# Patient Record
Sex: Male | Born: 1950 | Race: White | Hispanic: No | Marital: Married | Smoking: Former smoker
Health system: Southern US, Community
[De-identification: ages and names within clinical notes are randomized; demographics above are authoritative.]

## PROBLEM LIST (undated history)

## (undated) DIAGNOSIS — E785 Hyperlipidemia, unspecified: Secondary | ICD-10-CM

## (undated) DIAGNOSIS — I10 Essential (primary) hypertension: Secondary | ICD-10-CM

## (undated) DIAGNOSIS — E876 Hypokalemia: Secondary | ICD-10-CM

## (undated) DIAGNOSIS — N289 Disorder of kidney and ureter, unspecified: Secondary | ICD-10-CM

## (undated) DIAGNOSIS — R7303 Prediabetes: Secondary | ICD-10-CM

## (undated) DIAGNOSIS — I2510 Atherosclerotic heart disease of native coronary artery without angina pectoris: Secondary | ICD-10-CM

## (undated) DIAGNOSIS — I469 Cardiac arrest, cause unspecified: Secondary | ICD-10-CM

## (undated) DIAGNOSIS — E119 Type 2 diabetes mellitus without complications: Secondary | ICD-10-CM

## (undated) DIAGNOSIS — N529 Male erectile dysfunction, unspecified: Secondary | ICD-10-CM

## (undated) DIAGNOSIS — Z9289 Personal history of other medical treatment: Secondary | ICD-10-CM

## (undated) HISTORY — PX: HYDROCELE EXCISION: SHX482

## (undated) HISTORY — PX: VASECTOMY: SHX75

## (undated) HISTORY — DX: Essential (primary) hypertension: I10

## (undated) HISTORY — DX: Type 2 diabetes mellitus without complications: E11.9

## (undated) HISTORY — DX: Hyperlipidemia, unspecified: E78.5

## (undated) HISTORY — DX: Prediabetes: R73.03

## (undated) HISTORY — DX: Male erectile dysfunction, unspecified: N52.9

## (undated) HISTORY — DX: Personal history of other medical treatment: Z92.89

---

## 2000-12-23 ENCOUNTER — Encounter: Admission: RE | Admit: 2000-12-23 | Discharge: 2000-12-23 | Payer: Self-pay | Admitting: Family Medicine

## 2000-12-23 ENCOUNTER — Encounter: Payer: Self-pay | Admitting: Family Medicine

## 2001-04-15 ENCOUNTER — Encounter (INDEPENDENT_AMBULATORY_CARE_PROVIDER_SITE_OTHER): Payer: Self-pay | Admitting: Specialist

## 2001-04-15 ENCOUNTER — Ambulatory Visit (HOSPITAL_COMMUNITY): Admission: RE | Admit: 2001-04-15 | Discharge: 2001-04-15 | Payer: Self-pay | Admitting: Gastroenterology

## 2002-11-05 ENCOUNTER — Inpatient Hospital Stay (HOSPITAL_COMMUNITY): Admission: AD | Admit: 2002-11-05 | Discharge: 2002-11-07 | Payer: Self-pay | Admitting: Internal Medicine

## 2002-11-06 ENCOUNTER — Encounter (INDEPENDENT_AMBULATORY_CARE_PROVIDER_SITE_OTHER): Payer: Self-pay | Admitting: *Deleted

## 2002-11-06 ENCOUNTER — Encounter: Payer: Self-pay | Admitting: Internal Medicine

## 2004-04-23 ENCOUNTER — Ambulatory Visit (HOSPITAL_BASED_OUTPATIENT_CLINIC_OR_DEPARTMENT_OTHER): Admission: RE | Admit: 2004-04-23 | Discharge: 2004-04-23 | Payer: Self-pay | Admitting: Urology

## 2005-12-16 ENCOUNTER — Ambulatory Visit: Payer: Self-pay | Admitting: Family Medicine

## 2005-12-21 ENCOUNTER — Ambulatory Visit: Payer: Self-pay | Admitting: Family Medicine

## 2006-06-30 ENCOUNTER — Ambulatory Visit: Payer: Self-pay | Admitting: Family Medicine

## 2006-06-30 LAB — CONVERTED CEMR LAB
ALT: 19 units/L (ref 0–40)
AST: 23 units/L (ref 0–37)
Cholesterol: 186 mg/dL (ref 0–200)
HDL: 48.8 mg/dL (ref >39.0)
LDL Cholesterol: 121 mg/dL — ABNORMAL HIGH (ref 0–99)
Total CHOL/HDL Ratio: 3.8
Triglycerides: 79 mg/dL (ref 0–149)
VLDL: 16 mg/dL (ref 0–40)

## 2007-01-12 ENCOUNTER — Ambulatory Visit: Payer: Self-pay | Admitting: Family Medicine

## 2007-01-12 DIAGNOSIS — E785 Hyperlipidemia, unspecified: Secondary | ICD-10-CM

## 2007-01-12 DIAGNOSIS — F528 Other sexual dysfunction not due to a substance or known physiological condition: Secondary | ICD-10-CM

## 2007-01-12 DIAGNOSIS — I10 Essential (primary) hypertension: Secondary | ICD-10-CM | POA: Insufficient documentation

## 2007-01-21 ENCOUNTER — Ambulatory Visit: Payer: Self-pay | Admitting: Family Medicine

## 2007-01-24 ENCOUNTER — Encounter (INDEPENDENT_AMBULATORY_CARE_PROVIDER_SITE_OTHER): Payer: Self-pay | Admitting: Family Medicine

## 2007-01-24 ENCOUNTER — Telehealth (INDEPENDENT_AMBULATORY_CARE_PROVIDER_SITE_OTHER): Payer: Self-pay | Admitting: *Deleted

## 2007-01-24 LAB — CONVERTED CEMR LAB
ALT: 27 units/L (ref 0–53)
AST: 26 units/L (ref 0–37)
BUN: 19 mg/dL (ref 6–23)
CO2: 32 meq/L (ref 19–32)
Calcium: 9.5 mg/dL (ref 8.4–10.5)
Chloride: 105 meq/L (ref 96–112)
Cholesterol: 173 mg/dL (ref 0–200)
Creatinine, Ser: 1.3 mg/dL (ref 0.4–1.5)
GFR calc Af Amer: 73 mL/min
GFR calc non Af Amer: 60 mL/min
Glucose, Bld: 97 mg/dL (ref 70–99)
HDL: 56.4 mg/dL (ref >39.0)
LDL Cholesterol: 105 mg/dL — ABNORMAL HIGH (ref 0–99)
PSA: 2.53 ng/mL (ref 0.10–4.00)
Potassium: 4.4 meq/L (ref 3.5–5.1)
Sodium: 140 meq/L (ref 135–145)
Total CHOL/HDL Ratio: 3.1
Triglycerides: 57 mg/dL (ref 0–149)
VLDL: 11 mg/dL (ref 0–40)

## 2007-08-01 ENCOUNTER — Telehealth (INDEPENDENT_AMBULATORY_CARE_PROVIDER_SITE_OTHER): Payer: Self-pay | Admitting: *Deleted

## 2007-10-21 ENCOUNTER — Telehealth (INDEPENDENT_AMBULATORY_CARE_PROVIDER_SITE_OTHER): Payer: Self-pay | Admitting: *Deleted

## 2007-10-24 ENCOUNTER — Telehealth (INDEPENDENT_AMBULATORY_CARE_PROVIDER_SITE_OTHER): Payer: Self-pay | Admitting: *Deleted

## 2007-10-26 ENCOUNTER — Telehealth (INDEPENDENT_AMBULATORY_CARE_PROVIDER_SITE_OTHER): Payer: Self-pay | Admitting: *Deleted

## 2007-11-07 ENCOUNTER — Ambulatory Visit: Payer: Self-pay | Admitting: Internal Medicine

## 2008-01-23 ENCOUNTER — Ambulatory Visit: Payer: Self-pay | Admitting: Internal Medicine

## 2008-02-02 ENCOUNTER — Telehealth (INDEPENDENT_AMBULATORY_CARE_PROVIDER_SITE_OTHER): Payer: Self-pay | Admitting: *Deleted

## 2008-02-03 ENCOUNTER — Ambulatory Visit: Payer: Self-pay | Admitting: Internal Medicine

## 2008-02-10 ENCOUNTER — Telehealth (INDEPENDENT_AMBULATORY_CARE_PROVIDER_SITE_OTHER): Payer: Self-pay | Admitting: *Deleted

## 2008-11-14 ENCOUNTER — Telehealth (INDEPENDENT_AMBULATORY_CARE_PROVIDER_SITE_OTHER): Payer: Self-pay | Admitting: *Deleted

## 2009-01-28 ENCOUNTER — Telehealth (INDEPENDENT_AMBULATORY_CARE_PROVIDER_SITE_OTHER): Payer: Self-pay | Admitting: *Deleted

## 2009-02-27 ENCOUNTER — Ambulatory Visit: Payer: Self-pay | Admitting: Internal Medicine

## 2009-02-28 ENCOUNTER — Ambulatory Visit: Payer: Self-pay | Admitting: Internal Medicine

## 2009-03-04 ENCOUNTER — Encounter (INDEPENDENT_AMBULATORY_CARE_PROVIDER_SITE_OTHER): Payer: Self-pay | Admitting: *Deleted

## 2009-03-04 LAB — CONVERTED CEMR LAB
ALT: 27 units/L (ref 0–53)
AST: 26 units/L (ref 0–37)
BUN: 17 mg/dL (ref 6–23)
Basophils Absolute: 0 10*3/uL (ref 0.0–0.1)
Basophils Relative: 0 % (ref 0.0–3.0)
CO2: 29 meq/L (ref 19–32)
Calcium: 9.2 mg/dL (ref 8.4–10.5)
Chloride: 107 meq/L (ref 96–112)
Cholesterol: 179 mg/dL (ref 0–200)
Creatinine, Ser: 1.3 mg/dL (ref 0.4–1.5)
Eosinophils Absolute: 0.1 10*3/uL (ref 0.0–0.7)
Eosinophils Relative: 1.2 % (ref 0.0–5.0)
GFR calc non Af Amer: 59.82 mL/min (ref >60)
Glucose, Bld: 105 mg/dL — ABNORMAL HIGH (ref 70–99)
HCT: 40 % (ref 39.0–52.0)
HDL: 53.2 mg/dL (ref >39.00)
Hemoglobin: 13.9 g/dL (ref 13.0–17.0)
LDL Cholesterol: 108 mg/dL — ABNORMAL HIGH (ref 0–99)
Lymphocytes Relative: 19 % (ref 12.0–46.0)
Lymphs Abs: 1 10*3/uL (ref 0.7–4.0)
MCHC: 34.7 g/dL (ref 30.0–36.0)
MCV: 90.4 fL (ref 78.0–100.0)
Monocytes Absolute: 0.5 10*3/uL (ref 0.1–1.0)
Monocytes Relative: 9.1 % (ref 3.0–12.0)
Neutro Abs: 3.9 10*3/uL (ref 1.4–7.7)
Neutrophils Relative %: 70.7 % (ref 43.0–77.0)
PSA: 2.16 ng/mL (ref 0.10–4.00)
Platelets: 198 10*3/uL (ref 150.0–400.0)
Potassium: 4.2 meq/L (ref 3.5–5.1)
RBC: 4.42 M/uL (ref 4.22–5.81)
RDW: 12.3 % (ref 11.5–14.6)
Sodium: 142 meq/L (ref 135–145)
TSH: 2.33 microintl units/mL (ref 0.35–5.50)
Total CHOL/HDL Ratio: 3
Triglycerides: 87 mg/dL (ref 0.0–149.0)
VLDL: 17.4 mg/dL (ref 0.0–40.0)
WBC: 5.5 10*3/uL (ref 4.5–10.5)

## 2009-03-19 ENCOUNTER — Ambulatory Visit: Payer: Self-pay | Admitting: Family

## 2009-03-19 LAB — CONVERTED CEMR LAB: Rapid Strep: NEGATIVE

## 2009-08-22 ENCOUNTER — Ambulatory Visit: Payer: Self-pay | Admitting: Family Medicine

## 2010-03-03 ENCOUNTER — Ambulatory Visit: Payer: Self-pay | Admitting: Internal Medicine

## 2010-03-04 ENCOUNTER — Ambulatory Visit: Payer: Self-pay | Admitting: Internal Medicine

## 2010-03-04 LAB — CONVERTED CEMR LAB
ALT: 22 units/L (ref 0–53)
AST: 23 units/L (ref 0–37)
BUN: 19 mg/dL (ref 6–23)
Basophils Absolute: 0 10*3/uL (ref 0.0–0.1)
Basophils Relative: 0.7 % (ref 0.0–3.0)
CO2: 29 meq/L (ref 19–32)
Calcium: 9.4 mg/dL (ref 8.4–10.5)
Chloride: 103 meq/L (ref 96–112)
Cholesterol: 179 mg/dL (ref 0–200)
Creatinine, Ser: 1.3 mg/dL (ref 0.4–1.5)
Eosinophils Absolute: 0.1 10*3/uL (ref 0.0–0.7)
Eosinophils Relative: 0.9 % (ref 0.0–5.0)
GFR calc non Af Amer: 57.57 mL/min (ref >60)
Glucose, Bld: 104 mg/dL — ABNORMAL HIGH (ref 70–99)
HCT: 35.3 % — ABNORMAL LOW (ref 39.0–52.0)
HDL: 47.4 mg/dL (ref >39.00)
Hemoglobin: 12.4 g/dL — ABNORMAL LOW (ref 13.0–17.0)
LDL Cholesterol: 112 mg/dL — ABNORMAL HIGH (ref 0–99)
Lymphocytes Relative: 19.2 % (ref 12.0–46.0)
Lymphs Abs: 1.2 10*3/uL (ref 0.7–4.0)
MCHC: 35.2 g/dL (ref 30.0–36.0)
MCV: 86.8 fL (ref 78.0–100.0)
Monocytes Absolute: 0.6 10*3/uL (ref 0.1–1.0)
Monocytes Relative: 8.7 % (ref 3.0–12.0)
Neutro Abs: 4.5 10*3/uL (ref 1.4–7.7)
Neutrophils Relative %: 70.5 % (ref 43.0–77.0)
PSA: 3.46 ng/mL (ref 0.10–4.00)
Platelets: 251 10*3/uL (ref 150.0–400.0)
Potassium: 4.3 meq/L (ref 3.5–5.1)
RBC: 4.07 M/uL — ABNORMAL LOW (ref 4.22–5.81)
RDW: 12.6 % (ref 11.5–14.6)
Sodium: 140 meq/L (ref 135–145)
TSH: 1.63 microintl units/mL (ref 0.35–5.50)
Total CHOL/HDL Ratio: 4
Triglycerides: 99 mg/dL (ref 0.0–149.0)
VLDL: 19.8 mg/dL (ref 0.0–40.0)
WBC: 6.4 10*3/uL (ref 4.5–10.5)

## 2010-03-10 DIAGNOSIS — R7303 Prediabetes: Secondary | ICD-10-CM

## 2010-03-10 HISTORY — DX: Prediabetes: R73.03

## 2010-03-10 LAB — CONVERTED CEMR LAB
Ferritin: 36.1 ng/mL (ref 22.0–322.0)
Hgb A1c MFr Bld: 6.2 % (ref 4.6–6.5)
Iron: 93 ug/dL (ref 42–165)

## 2010-03-26 ENCOUNTER — Encounter: Payer: Self-pay | Admitting: Internal Medicine

## 2010-03-26 ENCOUNTER — Encounter
Admission: RE | Admit: 2010-03-26 | Discharge: 2010-05-27 | Payer: Self-pay | Source: Home / Self Care | Attending: Internal Medicine | Admitting: Internal Medicine

## 2010-04-07 ENCOUNTER — Ambulatory Visit: Payer: Self-pay | Admitting: Internal Medicine

## 2010-05-16 ENCOUNTER — Other Ambulatory Visit: Payer: Self-pay | Admitting: Gastroenterology

## 2010-05-16 ENCOUNTER — Encounter: Payer: Self-pay | Admitting: Internal Medicine

## 2010-05-25 LAB — CONVERTED CEMR LAB
ALT: 22 units/L (ref 0–53)
AST: 22 units/L (ref 0–37)
Albumin: 4 g/dL (ref 3.5–5.2)
Alkaline Phosphatase: 43 units/L (ref 39–117)
BUN: 17 mg/dL (ref 6–23)
Basophils Absolute: 0 10*3/uL (ref 0.0–0.1)
Basophils Relative: 0.7 % (ref 0.0–3.0)
Bilirubin, Direct: 0.1 mg/dL (ref 0.0–0.3)
CO2: 32 meq/L (ref 19–32)
Calcium: 8.9 mg/dL (ref 8.4–10.5)
Chloride: 109 meq/L (ref 96–112)
Cholesterol: 158 mg/dL (ref 0–200)
Creatinine, Ser: 1.4 mg/dL (ref 0.4–1.5)
Eosinophils Absolute: 0.1 10*3/uL (ref 0.0–0.7)
Eosinophils Relative: 1.3 % (ref 0.0–5.0)
GFR calc Af Amer: 67 mL/min
GFR calc non Af Amer: 55 mL/min
Glucose, Bld: 94 mg/dL (ref 70–99)
HCT: 33.2 % — ABNORMAL LOW (ref 39.0–52.0)
HDL: 48.1 mg/dL (ref >39.0)
Hemoglobin: 11.9 g/dL — ABNORMAL LOW (ref 13.0–17.0)
LDL Cholesterol: 97 mg/dL (ref 0–99)
Lymphocytes Relative: 24 % (ref 12.0–46.0)
MCHC: 35.7 g/dL (ref 30.0–36.0)
MCV: 87 fL (ref 78.0–100.0)
Monocytes Absolute: 0.5 10*3/uL (ref 0.1–1.0)
Monocytes Relative: 9.1 % (ref 3.0–12.0)
Neutro Abs: 3.3 10*3/uL (ref 1.4–7.7)
Neutrophils Relative %: 64.9 % (ref 43.0–77.0)
PSA: 2.12 ng/mL (ref 0.10–4.00)
Platelets: 214 10*3/uL (ref 150–400)
Potassium: 4.2 meq/L (ref 3.5–5.1)
RBC: 3.82 M/uL — ABNORMAL LOW (ref 4.22–5.81)
RDW: 12.1 % (ref 11.5–14.6)
Sodium: 141 meq/L (ref 135–145)
TSH: 1.56 microintl units/mL (ref 0.35–5.50)
Total Bilirubin: 0.7 mg/dL (ref 0.3–1.2)
Total CHOL/HDL Ratio: 3.3
Total Protein: 7 g/dL (ref 6.0–8.3)
Triglycerides: 67 mg/dL (ref 0–149)
VLDL: 13 mg/dL (ref 0–40)
WBC: 5.1 10*3/uL (ref 4.5–10.5)

## 2010-05-27 NOTE — Assessment & Plan Note (Signed)
Summary: pink eye//fd   Vital Signs:  Patient profile:   60 year old male Height:      69.5 inches Weight:      191 pounds BMI:     27.90 Pulse rate:   56 / minute BP sitting:   100 / 60 CC: c/o left eye red, feels gritty   History of Present Illness: 60 yo man here today for ? pink eye.  sxs started w/ eye redness a few days ago.  also notes that eye feels 'gritty'.  denies drainage from eye, no matting.  no fevers.  hx of similar 1 month ago, used OTC eye drops w/ resolution.  pt denies recent trauma or abrasion.  is a contact wearer but denies contact use since Monday.  Allergies (verified): 1)  ! Codeine 2)  ! * Cialis  Review of Systems      See HPI  Physical Exam  General:  Well-developed,well-nourished,in no acute distress; alert,appropriate and cooperative throughout examination Eyes:  L eye w/ conjunctival injxn, no tearing, matting or drainage.  contact in place  (-) fluoroscein exam   Impression & Recommendations:  Problem # 1:  CONJUNCTIVITIS NOS (ICD-372.30) Assessment New pt's inflammation likely due to retained contact.  pt removed contact in office.  no sign of corneal abrasion.  will start abx drops to tx possible infxn due to dirty contact.  advised pt to remove contacts during treatment.  reviewed supportive care and red flags that should prompt return.  Pt expresses understanding and is in agreement w/ this plan. His updated medication list for this problem includes:    Ciprofloxacin Hcl 0.3 % Soln (Ciprofloxacin hcl) .Marland Kitchen... 2 drops in affected eye every 4 hrs while awake for 7 days  Complete Medication List: 1)  Lisinopril 10 Mg Tabs (Lisinopril) .... Take one tablet daily 2)  Hydrochlorothiazide 25 Mg Tabs (Hydrochlorothiazide) .... Take one tabet daily 3)  Lipitor 20 Mg Tabs (Atorvastatin calcium) .... Take one tablet daily 4)  Adult Aspirin Ec Low Strength 81 Mg Tbec (Aspirin) 5)  Vitamin C  6)  Ciprofloxacin Hcl 0.3 % Soln (Ciprofloxacin hcl) ....  2 drops in affected eye every 4 hrs while awake for 7 days  Patient Instructions: 1)  Follow up as needed 2)  Use the antibiotic drops as directed 3)  NO CONTACTS in the L eye until you're done with the drops 4)  Call with any questions or concerns Prescriptions: CIPROFLOXACIN HCL 0.3 % SOLN (CIPROFLOXACIN HCL) 2 drops in affected eye every 4 hrs while awake for 7 days  #5 ml x 0   Entered and Authorized by:   Neena Rhymes MD   Signed by:   Neena Rhymes MD on 08/22/2009   Method used:   Electronically to        [facility] [address] [id number]* (retail)       [address]       [place], [place]  [address]       Ph: [phone]       Fax: [phone]   RxID:   [id number]

## 2010-05-27 NOTE — Assessment & Plan Note (Signed)
Summary: CPX/KN   Vital Signs:  Patient profile:   60 year old male Height:      69.5 inches (176.53 cm) Weight:      194.25 pounds (88.30 kg) BMI:     28.38 O2 Sat:      96 % on Room air Temp:     98.2 degrees F (36.78 degrees C) oral Pulse rate:   93 / minute BP sitting:   140 / 84  (left arm)  Vitals Entered By: Lucious Groves CMA (March 03, 2010 12:52 PM)  O2 Flow:  Room air CC: CPX/kb Is Patient Diabetic? No Pain Assessment Patient in pain? no      Comments Patient notes that he is not fasting and does not need any medication refills at this time./kb   History of Present Illness: CPX   Current Medications (verified): 1)  Lisinopril 10 Mg Tabs (Lisinopril) .... Take One Tablet Daily 2)  Hydrochlorothiazide 25 Mg Tabs (Hydrochlorothiazide) .... Take One Tabet Daily 3)  Lipitor 20 Mg  Tabs (Atorvastatin Calcium) .... Take One Tablet Daily 4)  Adult Aspirin Ec Low Strength 81 Mg  Tbec (Aspirin) 5)  Vitamin C  Allergies (verified): 1)  ! Codeine 2)  ! * Cialis  Past History:  Past Medical History: Reviewed history from 11/07/2007 and no changes required. Hyperlipidemia Hypertension ED  Past Surgical History: Reviewed history from 01/12/2007 and no changes required. hydrocele Vasectomy  Family History: DM - M, F HTN - M stroke - no CAD - M had a CABG @ 60 y/o colon Ca - no prostate Ca - no F - deceased cirrhosis (+ETOH abuse)  Social History: Married children --2 daughters Never Smoked Alcohol use-yes Drug use-no Regular exercise- -yes, walks  the dog 5/week, 45 min. Occ. goes to the [employer] Occupation: Airline pilot, machinery  Review of Systems General:  Denies fatigue and fever. CV:  Denies chest pain or discomfort, palpitations, and swelling of feet. Resp:  Denies cough, shortness of breath, and wheezing. GI:  Denies bloody stools, diarrhea, and nausea. GU:  Denies dysuria, hematuria, and urinary hesitancy; some frecuency , mostly related to  caffeine intake . Psych:  Denies anxiety and depression.  Physical Exam  General:  alert, well-developed, and well-nourished.   Neck:  no masses, no thyromegaly, and normal carotid upstroke.   Lungs:  Normal respiratory effort, chest expands symmetrically. Lungs are clear to auscultation, no crackles or wheezes. Heart:  Normal rate and regular rhythm.   Abdomen:  soft, non-tender, no distention, no masses, no guarding, no rigidity, and no rebound tenderness.   Rectal:  No external abnormalities noted. Normal sphincter tone. No rectal masses or tenderness. Prostate:  Prostate gland firm and smooth, no enlargement, nodularity, tenderness, mass, asymmetry or induration. Extremities:  no pretibial edema bilaterally  Psych:  Oriented X3, memory intact for recent and remote, normally interactive, good eye contact, not anxious appearing, and not depressed appearing.     Impression & Recommendations:  Problem # 1:  WELL ADULT EXAM (ICD-V70.0) Td 09 flu shot today  s/p 2 Cscopes, Last Cscope report in the chart is from 11-06:tics, polyps (Dr Matthias Hughs) 1 polyp was adenomatous, next Cscope 2011 will refer to Dr Matthias Hughs  (although patient said today it may be due  in 2016)   diet exercise discussed      Problem # 2:  HYPERTENSION (ICD-401.9) no change, see instructions His updated medication list for this problem includes:    Lisinopril 10 Mg Tabs (Lisinopril) .Marland KitchenMarland KitchenMarland KitchenMarland Kitchen  Take one tablet daily    Hydrochlorothiazide 25 Mg Tabs (Hydrochlorothiazide) .Marland Kitchen... Take one tabet daily  BP today: 140/84 Prior BP: 100/60 (08/22/2009)  Labs Reviewed: K+: 4.2 (02/28/2009) Creat: : 1.3 (02/28/2009)   Chol: 179 (02/28/2009)   HDL: 53.20 (02/28/2009)   LDL: 108 (02/28/2009)   TG: 87.0 (02/28/2009)  Problem # 3:  HYPERLIPIDEMIA (ICD-272.4) labs His updated medication list for this problem includes:    Lipitor 20 Mg Tabs (Atorvastatin calcium) .Marland Kitchen... Take one tablet daily  Labs Reviewed: SGOT: 26  (02/28/2009)   SGPT: 27 (02/28/2009)   HDL:53.20 (02/28/2009), 48.1 (01/23/2008)  LDL:108 (02/28/2009), 97 (16/01/9603)  Chol:179 (02/28/2009), 158 (01/23/2008)  Trig:87.0 (02/28/2009), 67 (01/23/2008)  Complete Medication List: 1)  Lisinopril 10 Mg Tabs (Lisinopril) .... Take one tablet daily 2)  Hydrochlorothiazide 25 Mg Tabs (Hydrochlorothiazide) .... Take one tabet daily 3)  Lipitor 20 Mg Tabs (Atorvastatin calcium) .... Take one tablet daily 4)  Adult Aspirin Ec Low Strength 81 Mg Tbec (Aspirin) 5)  Vitamin C   Other Orders: Admin 1st Vaccine (54098) Flu Vaccine 32yrs + (11914) Gastroenterology Referral (GI)  Patient Instructions: 1)  please come back fasting 2)  BMP, CBC, AST, ALT, TSH, PSA, FLP---dx V70 3)  Check your blood pressure 2 or 3 times a week. If it is more than 140/85 consistently,please let us know a 4)  Please schedule a follow-up appointment in 1 year.  Prescriptions: LIPITOR 20 MG  TABS (ATORVASTATIN CALCIUM) Take one tablet daily  #90 x 4   Entered and Authorized by:   Nolon Rod. Paz MD   Signed by:   Nolon Rod. Paz MD on 03/03/2010   Method used:   Print then Give to Patient   RxID:   [id number] HYDROCHLOROTHIAZIDE 25 MG TABS (HYDROCHLOROTHIAZIDE) Take one tabet daily  #90 x 4   Entered and Authorized by:   Nolon Rod. Paz MD   Signed by:   Nolon Rod. Paz MD on 03/03/2010   Method used:   Print then Give to Patient   RxID:   [id number] LISINOPRIL 10 MG TABS (LISINOPRIL) Take one tablet daily  #90 x 4   Entered and Authorized by:   Nolon Rod. Paz MD   Signed by:   Nolon Rod. Paz MD on 03/03/2010   Method used:   Print then Give to Patient   RxID:   [id number]    Orders Added: 1)  Admin 1st Vaccine [90471] 2)  Flu Vaccine 72yrs + [59563] 3)  Gastroenterology Referral [GI] 4)  Est. Patient age 50-64 [74]       Flu Vaccine Consent Questions     Do you have a history of severe allergic reactions to this vaccine? no    Any prior history of  allergic reactions to egg and/or gelatin? no    Do you have a sensitivity to the preservative Thimersol? no    Do you have a past history of Guillan-Barre Syndrome? no    Do you currently have an acute febrile illness? no    Have you ever had a severe reaction to latex? no    Vaccine information given and explained to patient? yes    Are you currently pregnant? no    Lot Number:[id number]   Exp Date:10/25/2010   Site Given  Right Deltoid IM  .lbflu

## 2010-05-27 NOTE — Consult Note (Signed)
Summary: [facility]  [facility]   Imported By: Lanelle Bal 04/03/2010 14:47:15  _____________________________________________________________________  External Attachment:    Type:   Image     Comment:   External Document

## 2010-05-29 NOTE — Assessment & Plan Note (Signed)
Summary: left eye red, itchy///sph   Vital Signs:  Patient profile:   60 year old male Weight:      193.50 pounds Temp:     98.3 degrees F oral Pulse rate:   76 / minute Pulse rhythm:   regular BP sitting:   132 / 82  (left arm) Cuff size:   regular  Vitals Entered By: Army Fossa CMA (April 07, 2010 1:25 PM) CC: Pt here c/o (L) eye irritation.  Comments Has tried to flush it.    History of Present Illness: L eye irritation x 1 day symptoms worse after he removed his contact yesterday irritation described as a  FB feeling  ROS no d/c but eye slightly  watery no FB in there that he suspects  L vision slightly  blurred   Current Medications (verified): 1)  Lisinopril 10 Mg Tabs (Lisinopril) .... Take One Tablet Daily 2)  Hydrochlorothiazide 25 Mg Tabs (Hydrochlorothiazide) .... Take One Tabet Daily 3)  Lipitor 20 Mg  Tabs (Atorvastatin Calcium) .... Take One Tablet Daily 4)  Adult Aspirin Ec Low Strength 81 Mg  Tbec (Aspirin) 5)  Vitamin C  Allergies (verified): 1)  ! Codeine 2)  ! * Cialis  Past History:  Past Medical History: Reviewed history from 11/07/2007 and no changes required. Hyperlipidemia Hypertension ED  Past Surgical History: Reviewed history from 01/12/2007 and no changes required. hydrocele Vasectomy  Physical Exam  General:  alert and well-developed.   Eyes:  EOMI R eye normal L eye, slightly  red, ant chamber normal, pupil normal. Fluorescein exam neg for corneal abnormalities  or FB   Impression & Recommendations:  Problem # 1:  CONJUNCTIVITIS (ICD-372.30) see instructions  His updated medication list for this problem includes:    Garamycin 0.3 % Soln (Gentamicin sulfate) .Marland KitchenMarland KitchenMarland KitchenMarland Kitchen 4 drops  of the ophtalmic solution os every 4 hours x 5 days  Complete Medication List: 1)  Lisinopril 10 Mg Tabs (Lisinopril) .... Take one tablet daily 2)  Hydrochlorothiazide 25 Mg Tabs (Hydrochlorothiazide) .... Take one tabet daily 3)  Lipitor  20 Mg Tabs (Atorvastatin calcium) .... Take one tablet daily 4)  Adult Aspirin Ec Low Strength 81 Mg Tbec (Aspirin) 5)  Vitamin C  6)  Garamycin 0.3 % Soln (Gentamicin sulfate) .... 4 drops  of the ophtalmic solution os every 4 hours x 5 days  Patient Instructions: 1)  cold compress and artificial tears as needed  2)  no contact at the L eye until eye is back to normal 3)  eye drops x 5 days 4)  see your eye doctor inmediately if symptoms worsen or no better in 3 days  Prescriptions: GARAMYCIN 0.3 % SOLN (GENTAMICIN SULFATE) 4 drops  of the ophtalmic solution OS every 4 hours x 5 days  #1 x 0   Entered and Authorized by:   Jose E. Paz MD   Signed by:   Nolon Rod. Paz MD on 04/07/2010   Method used:   Print then Give to Patient   RxID:   [id number]    Orders Added: 1)  Est. Patient Level III [08657]

## 2010-06-12 NOTE — Procedures (Signed)
Summary: Colonoscopy/[facility]  Colonoscopy/[facility]   Imported By: Sherian Rein 06/03/2010 14:02:03  _____________________________________________________________________  External Attachment:    Type:   Image     Comment:   External Document
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
## 2010-09-12 NOTE — Procedures (Signed)
[facility]  Patient:    Tyler Madden, [facility]en Visit Number: [id number] MRN: [id number]          Service Type: END Location: ENDO Attending Physician:  Rich Brave Dictated by:   Florencia Reasons, M.D. Proc. Date: 04/15/01 Admit Date:  04/15/2001   CC:         Louis ___________, M.D., [facility]   Procedure Report  PROCEDURE:  Colonoscopy with polypectomy.  INDICATIONS FOR PROCEDURE:  This is a 60 year old gentleman who underwent a screening flexible sigmoidoscopy Dr. ______ this morning and was found to have some polyps present. The patient had already undergone a complete colon prep. Same-day colonoscopic polypectomy was requested. The patient stopped by our office to watch the colonoscopy movie and, prior to the exam, I explained the purpose and risks of the procedure.  FINDINGS:  Two small polyps removed.  DESCRIPTION OF PROCEDURE:  The patient provided written consent. Sedation was fentanyl 75 mcg and Versed 9 mg IV without arrhythmias or desaturation. Digital exam of the prostate was unremarkable.  The procedure was initiated with the Olympus adult video colonoscope but this seemed to wedge or jam in the region of the distal descending colon, so we switched to the Olympus adjustable tension pediatric video colonoscope which was able to be negotiated quite easily around the colon to the cecum and for a short distance into a normal appearing terminal ileum, whereupon pullback was performed. The qualify of the prep was excellent and it is felt that all areas were well seen.  I identified and removed two polyps during this exam. The first was a roughly 2-3 mm x 5 mm sessile polyp in the region of the splenic flexure removed by a couple of hot biopsies. There was some transient oozing and when we checked this area later, the bleeding seemed to have stopped entirely but I went ahead and injected some epinephrine (1 cc total)  with nice blanching at the base of the polyp to help ensure maintenance of hemostasis.  The other polyp was a roughly 4-5 mm semipedunculated polyp on the fold at about 20 cm from the external anal opening, snared off with complete hemostasis and no evidence of excessive cautery.  No cancer, large polyps, colitis, vascular malformations, or diverticulosis were noted. Retroflexion of the rectum was normal.  The patient tolerated the procedure well and there were no apparent complications.  IMPRESSION:  Colon polyps removed as described above.  PLAN:  Await pathology. Dictated by:   Florencia Reasons, M.D. Attending Physician:  Rich Brave DD:  04/15/01 TD:  04/17/01 Job: [id number]

## 2010-09-12 NOTE — Op Note (Signed)
NAMEEZELL, POKE NO.:  [id number]   MEDICAL RECORD NO.:  [id number]          PATIENT TYPE:  AMB   LOCATION:  [facility]                         FACILITY:  [facility]   PHYSICIAN:  Valetta Fuller, M.D.  DATE OF BIRTH:  07/07/1950   DATE OF PROCEDURE:  04/23/2004  DATE OF DISCHARGE:                                 OPERATIVE REPORT   PREOPERATIVE DIAGNOSIS:  Large left hydrocele.   POSTOPERATIVE DIAGNOSIS:  Large left hydrocele.   PROCEDURE PERFORMED:  Hydrocele repair.   SURGEON:  Valetta Fuller, M.D.   ANESTHESIA:  General.   INDICATIONS:  Mr. Vacca is a 60 year old male.  I evaluated him three to  four years ago because of some left-sided scrotal swelling.  At that time an  ultrasound and clinical exam were consistent with a unilocular simple left  hydrocele.  The underlying testis was normal.  The patient did not feel that  this was bothersome at that time, and we recommended observation.  Over the  years this has increased in size and now becomes more uncomfortable for him  during certain activities.  On clinical exam, he clearly had a significant  enlargement of the left hydrocele.  We discussed with him the pros and cons  of hydrocele repair, and he elected to proceed with this.  He appeared to  understand the advantages and disadvantages, and full informed consent was  obtained.   TECHNIQUE AND FINDINGS:  The patient brought to the operating room, where he  had successful induction of general anesthesia.  He was prepped and draped  in the usual manner in the supine position.  A median raphe incision was  made in the scrotum and the left hemiscrotum was entered.  We saw what  appeared to be a thin-walled, large hydrocele that was quite tense.  Upon  opening the hydrocele sac, we obtained approximately 500 mL of clear straw-  colored fluid.  The testis and redundant sac were then brought out the  incision.  The sac itself was just mildly thickened, and  there was no other  evidence of any testicular or adnexal pathology.  We carefully dissected the  hydrocele sac off of surrounding spermatic cord and testicular structures.  Some of the redundant sac was then excised with electrocautery.  The ends of  the sac were then reefed in a Lord type of manner utilizing some 4-0 Vicryl  suture.  The testis was returned to the hemiscrotum, taking great care to be  sure that the vasculature was in no way twisted.  The scrotal compartment  was irrigated.  The dartos was then closed with a running 3-0 Vicryl and the  scrotal skin was closed with a running 4-0 Vicryl suture.  The patient  appeared to tolerate the procedure well.  There were no obvious  complications.      DSG/MEDQ  D:  04/23/2004  T:  04/23/2004  Job:  [id number]

## 2010-12-03 ENCOUNTER — Ambulatory Visit (INDEPENDENT_AMBULATORY_CARE_PROVIDER_SITE_OTHER): Payer: BC Managed Care – PPO | Admitting: Family

## 2010-12-03 ENCOUNTER — Encounter: Payer: Self-pay | Admitting: Family

## 2010-12-03 VITALS — BP 126/86 | HR 78 | Temp 98.1°F | Resp 18 | Ht 69.49 in | Wt 193.0 lb

## 2010-12-03 DIAGNOSIS — J40 Bronchitis, not specified as acute or chronic: Secondary | ICD-10-CM

## 2010-12-03 DIAGNOSIS — I499 Cardiac arrhythmia, unspecified: Secondary | ICD-10-CM

## 2010-12-03 MED ORDER — AZITHROMYCIN 250 MG PO TABS: ORAL_TABLET | ORAL | Status: AC

## 2010-12-03 MED ORDER — BENZONATATE 100 MG PO CAPS: 100.0000 mg | ORAL_CAPSULE | Freq: Four times a day (QID) | ORAL | Status: DC | PRN

## 2010-12-03 NOTE — Progress Notes (Signed)
  Subjective:    Patient ID: Tyler [facility]en, male    DOB: 03/20/1951, 60 y.o.   MRN: [id number]  HPI  [name] is a 60 yr old male who presents today with chief complaint of cough.  Symptoms started initially one week ago as a "tickle in throat."  Notes + associated nasal congestion, and pleuritic chest discomfort.  Energy is poor.  Appetite is good.  He has tried Coricidin with little improvement. Denies associated fever.  Some sweating.  Cough is dry, nasal congestion is clear.      Review of Systems See HPI  No past medical history on file.  History   Social History  . Marital Status: Married    Spouse Name: N/A    Number of Children: N/A  . Years of Education: N/A   Occupational History  . Not on file.   Social History Main Topics  . Smoking status: Former Smoker    Types: Cigarettes  . Smokeless tobacco: Never Used  . Alcohol Use: Not on file  . Drug Use: Not on file  . Sexually Active: Not on file   Other Topics Concern  . Not on file   Social History Narrative  . No narrative on file    No past surgical history on file.  No family history on file.  Allergies  Allergen Reactions  . Codeine     REACTION: nausea  . Tadalafil     REACTION: sob    No current outpatient prescriptions on file prior to visit.    BP 126/86  Pulse 78  Temp(Src) 98.1 F (36.7 C) (Oral)  Resp 18  Ht 5' 9.49" (1.765 m)  Wt 193 lb 0.6 oz (87.562 kg)  BMI 28.11 kg/m2  SpO2 98%       Objective:   Physical Exam  Constitutional: He appears well-developed and well-nourished.  HENT:  Head: Normocephalic and atraumatic.  Right Ear: Tympanic membrane and ear canal normal.  Left Ear: Tympanic membrane and ear canal normal.  Mouth/Throat: Uvula is midline, oropharynx is clear and moist and mucous membranes are normal.  Eyes: Conjunctivae are normal.  Neck: No thyromegaly present.  Cardiovascular: Normal rate.   No murmur heard.      Irregular rythm  Pulmonary/Chest:  Effort normal and breath sounds normal. No respiratory distress. He has no wheezes. He has no rales. He exhibits no tenderness.  Lymphadenopathy:    He has no cervical adenopathy.          Assessment & Plan:

## 2010-12-03 NOTE — Assessment & Plan Note (Signed)
URI symptoms x 1 week.  Pt notes bad cough, sweats.  Lung exam is clear/afebrile.  Will plan to treat empircally for bronchitis with Zithromax. Add tessalon for cough.

## 2010-12-03 NOTE — Patient Instructions (Signed)
You may use tylenol as needed for relief of body aches/sore throat.  Call if your symptoms worsen, if you develop fever >101, or if you are not feeling better in 48-72 hours.

## 2011-04-10 ENCOUNTER — Encounter: Payer: Self-pay | Admitting: Internal Medicine

## 2011-04-13 ENCOUNTER — Other Ambulatory Visit: Payer: Self-pay | Admitting: Internal Medicine

## 2011-04-13 ENCOUNTER — Ambulatory Visit (INDEPENDENT_AMBULATORY_CARE_PROVIDER_SITE_OTHER): Payer: BC Managed Care – PPO | Admitting: Internal Medicine

## 2011-04-13 DIAGNOSIS — Z0000 Encounter for general adult medical examination without abnormal findings: Secondary | ICD-10-CM

## 2011-04-13 DIAGNOSIS — Z23 Encounter for immunization: Secondary | ICD-10-CM

## 2011-04-13 DIAGNOSIS — E119 Type 2 diabetes mellitus without complications: Secondary | ICD-10-CM

## 2011-04-13 MED ORDER — ATORVASTATIN CALCIUM 20 MG PO TABS: 20.0000 mg | ORAL_TABLET | Freq: Every day | ORAL | Status: DC

## 2011-04-13 MED ORDER — HYDROCHLOROTHIAZIDE 25 MG PO TABS: 25.0000 mg | ORAL_TABLET | Freq: Every day | ORAL | Status: DC

## 2011-04-13 MED ORDER — LISINOPRIL 10 MG PO TABS: 10.0000 mg | ORAL_TABLET | Freq: Every day | ORAL | Status: DC

## 2011-04-13 NOTE — Patient Instructions (Signed)
Please came back fasting: FLP CMP CBC TSH PSA--- dx V70 A1C--- dx DM

## 2011-04-13 NOTE — Progress Notes (Signed)
  Subjective:    Patient ID: Tyler Madden, male    DOB: 12/14/1950, 60 y.o.   MRN: [id number]  HPI CPX Needs all  medications refill   Past Medical History: DM, mild A1C 6.2 03-2010 Hyperlipidemia Hypertension ED  Past Surgical History: hydrocele Vasectomy  Family History: DM - M, F HTN - M stroke - no CAD - M had a CABG @ 60 y/o colon Ca - no prostate Ca - no F - deceased cirrhosis (+ETOH abuse)  Social History: Married, children --2 daughters Never Smoked Alcohol use-yes Drug use-no Regular exercise- -yes, walks  the dog 3-4/week, ~ 60 min. Occ. goes to the "[employer]". Occupation: Airline pilot, machinery    Review of Systems No chest pain or shortness of breath No nausea, vomiting, diarrhea or blood in the stools No difficulty urinating or blood in the urine No anxiety or depression     Objective:   Physical Exam  Constitutional: He is oriented to person, place, and time. He appears well-developed and well-nourished.  HENT:  Head: Normocephalic and atraumatic.  Neck: No thyromegaly present.       Normal carotid pulse  Cardiovascular: Normal rate, regular rhythm and normal heart sounds.   No murmur heard. Pulmonary/Chest: Effort normal and breath sounds normal. No respiratory distress. He has no wheezes. He has no rales.  Abdominal: Soft. He exhibits no distension. There is no tenderness. There is no rebound and no guarding.  Genitourinary: Rectum normal and prostate normal.  Musculoskeletal: He exhibits no edema.  Neurological: He is alert and oriented to person, place, and time.  Psychiatric: He has a normal mood and affect. His behavior is normal. Thought content normal.          Assessment & Plan:

## 2011-04-13 NOTE — Assessment & Plan Note (Signed)
Td 09 flu shot today shingles shot discussed S/p 3 Cscopes, Last Cscope 04-2010, serratous adenoma, next 5 years  diet exercise discussed

## 2011-04-13 NOTE — Assessment & Plan Note (Addendum)
Last year, his hemoglobin A1c was 6.2, dx of mild diabetes discussed, emphasized the need for diet and exercise, reading material provided

## 2011-04-14 ENCOUNTER — Other Ambulatory Visit (INDEPENDENT_AMBULATORY_CARE_PROVIDER_SITE_OTHER): Payer: BC Managed Care – PPO

## 2011-04-14 DIAGNOSIS — Z0000 Encounter for general adult medical examination without abnormal findings: Secondary | ICD-10-CM

## 2011-04-14 LAB — COMPREHENSIVE METABOLIC PANEL
ALT: 25 U/L (ref 0–53)
Alkaline Phosphatase: 57 U/L (ref 39–117)
CO2: 28 mEq/L (ref 19–32)
Sodium: 140 mEq/L (ref 135–145)
Total Bilirubin: 0.6 mg/dL (ref 0.3–1.2)
Total Protein: 6.6 g/dL (ref 6.0–8.3)

## 2011-04-14 LAB — LIPID PANEL
HDL: 50 mg/dL (ref >39.00)
Total CHOL/HDL Ratio: 4
VLDL: 28 mg/dL (ref 0.0–40.0)

## 2011-04-22 ENCOUNTER — Other Ambulatory Visit (INDEPENDENT_AMBULATORY_CARE_PROVIDER_SITE_OTHER): Payer: BC Managed Care – PPO

## 2011-04-22 DIAGNOSIS — E119 Type 2 diabetes mellitus without complications: Secondary | ICD-10-CM

## 2011-04-22 LAB — HEMOGLOBIN A1C: Hgb A1c MFr Bld: 5.8 % (ref 4.6–6.5)

## 2011-04-24 ENCOUNTER — Encounter: Payer: Self-pay | Admitting: Internal Medicine

## 2011-04-29 ENCOUNTER — Encounter: Payer: Self-pay | Admitting: *Deleted

## 2011-04-29 MED ORDER — ROSUVASTATIN CALCIUM 20 MG PO TABS: 20.0000 mg | ORAL_TABLET | Freq: Every day | ORAL | Status: DC

## 2011-04-30 ENCOUNTER — Other Ambulatory Visit: Payer: Self-pay

## 2011-04-30 MED ORDER — ROSUVASTATIN CALCIUM 20 MG PO TABS: 20.0000 mg | ORAL_TABLET | Freq: Every day | ORAL | Status: AC

## 2011-05-04 ENCOUNTER — Other Ambulatory Visit: Payer: Self-pay

## 2011-07-28 ENCOUNTER — Other Ambulatory Visit: Payer: Self-pay | Admitting: *Deleted

## 2011-07-28 NOTE — Telephone Encounter (Signed)
Would you like for me to contact the pharmacy or change to an alternative med? Please advise.

## 2011-07-28 NOTE — Telephone Encounter (Signed)
Pt left vm advising that his insurance company will not cover Crestor, he was advised by his pharmacy to have MD Paz contact the pharmacy to advise that the pt needs the Crestor,please advise

## 2011-07-28 NOTE — Telephone Encounter (Signed)
Patient wasn't able to get at his goal on Lipitor so he was changed to Crestor. I guess if needed they will have to send a  prior authorization

## 2011-07-29 NOTE — Telephone Encounter (Signed)
Spoke with pt & had him contact his pharmacy to fax over a prior auth.

## 2011-08-03 ENCOUNTER — Other Ambulatory Visit: Payer: Self-pay | Admitting: Internal Medicine

## 2011-08-04 NOTE — Telephone Encounter (Signed)
Waiting for prior auth

## 2012-02-23 ENCOUNTER — Ambulatory Visit (INDEPENDENT_AMBULATORY_CARE_PROVIDER_SITE_OTHER)
Admission: RE | Admit: 2012-02-23 | Discharge: 2012-02-23 | Disposition: A | Discharge status: Home / Self Care | Payer: BC Managed Care – PPO | Source: Ambulatory Visit | Attending: Internal Medicine | Admitting: Internal Medicine

## 2012-02-23 ENCOUNTER — Encounter: Payer: Self-pay | Admitting: Internal Medicine

## 2012-02-23 ENCOUNTER — Ambulatory Visit (INDEPENDENT_AMBULATORY_CARE_PROVIDER_SITE_OTHER): Payer: BC Managed Care – PPO | Admitting: Internal Medicine

## 2012-02-23 ENCOUNTER — Encounter: Payer: Self-pay | Admitting: *Deleted

## 2012-02-23 VITALS — BP 128/80 | HR 69 | Temp 97.9°F | Wt 197.0 lb

## 2012-02-23 DIAGNOSIS — M79676 Pain in unspecified toe(s): Secondary | ICD-10-CM | POA: Insufficient documentation

## 2012-02-23 DIAGNOSIS — Z23 Encounter for immunization: Secondary | ICD-10-CM

## 2012-02-23 DIAGNOSIS — M79609 Pain in unspecified limb: Secondary | ICD-10-CM

## 2012-02-23 NOTE — Progress Notes (Signed)
  Subjective:    Patient ID: Tyler Madden, male    DOB: December 04, 1950, 61 y.o.   MRN: [id number]  HPI Visit 4 weeks ago his right second toe was swollen, quite tender, warm and red. He did not see any discharge or boil. Symptoms gradually decrease and now he is essentially asymptomatic.  Past Medical History: DM, mild A1C 6.2 03-2010 Hyperlipidemia Hypertension ED  Past Surgical History: hydrocele Vasectomy  Family History: DM - M, F HTN - M stroke - no CAD - M had a CABG @ 61 y/o colon Ca - no prostate Ca - no F - deceased cirrhosis (+ETOH abuse)  Social History: Married, children --2 daughters Never Smoked Alcohol use-yes Drug use-no Regular exercise- -yes, walks  the dog 3-4/week, ~ 60 min. Occ. goes to the "[employer]". Occupation: Airline pilot, machinery    Review of Systems He denies a  formal diagnosis of gout but he has a family history, from time to time he has episodes that he thinks are gout: joint pain swelling, usually  exacerbated by eating hot dogs.    Objective:   Physical Exam  General -- alert, well-developed, and well-nourished.   Extremities-- no pretibial edema bilaterally  Left foot normal Right foot normal except for the second toe, the PIP is quite enlarged and different from the left side, no actual warmness-redness but there is some tenderness. Psych-- Cognition and judgment appear intact. Alert and cooperative with normal attention span and concentration.  not anxious appearing and not depressed appearing.        Assessment & Plan:

## 2012-02-23 NOTE — Patient Instructions (Addendum)
Please get your x-ray at the other [facility]  office located at: [address], across from [facility].  Please go to the basement, this is a walk-in facility, they are open from 8:30 to 5:30 PM. Phone number [phone].  

## 2012-02-23 NOTE — Assessment & Plan Note (Signed)
Recent warmness and redness of the second right toe, now back to baseline w/ a quite enlarged PIP. 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 Plan: X-ray Referral Gout diet provided Will check a uric acid when he comes back for physical

## 2012-05-06 ENCOUNTER — Ambulatory Visit (INDEPENDENT_AMBULATORY_CARE_PROVIDER_SITE_OTHER): Payer: BC Managed Care – PPO | Admitting: Internal Medicine

## 2012-05-06 ENCOUNTER — Encounter: Payer: Self-pay | Admitting: Internal Medicine

## 2012-05-06 VITALS — BP 126/82 | HR 63 | Temp 97.5°F | Ht 70.5 in | Wt 201.0 lb

## 2012-05-06 DIAGNOSIS — Z2911 Encounter for prophylactic immunotherapy for respiratory syncytial virus (RSV): Secondary | ICD-10-CM

## 2012-05-06 DIAGNOSIS — I10 Essential (primary) hypertension: Secondary | ICD-10-CM

## 2012-05-06 DIAGNOSIS — Z0000 Encounter for general adult medical examination without abnormal findings: Secondary | ICD-10-CM

## 2012-05-06 DIAGNOSIS — Z23 Encounter for immunization: Secondary | ICD-10-CM

## 2012-05-06 DIAGNOSIS — E119 Type 2 diabetes mellitus without complications: Secondary | ICD-10-CM

## 2012-05-06 DIAGNOSIS — E785 Hyperlipidemia, unspecified: Secondary | ICD-10-CM

## 2012-05-06 LAB — CBC WITH DIFFERENTIAL/PLATELET
Basophils Absolute: 0 10*3/uL (ref 0.0–0.1)
Eosinophils Absolute: 0.1 10*3/uL (ref 0.0–0.7)
Lymphocytes Relative: 22.3 % (ref 12.0–46.0)
MCHC: 34 g/dL (ref 30.0–36.0)
Monocytes Absolute: 0.4 10*3/uL (ref 0.1–1.0)
Neutro Abs: 3.6 10*3/uL (ref 1.4–7.7)
Neutrophils Relative %: 68.5 % (ref 43.0–77.0)
RDW: 13.2 % (ref 11.5–14.6)

## 2012-05-06 LAB — LIPID PANEL
HDL: 45.4 mg/dL (ref >39.00)
Triglycerides: 114 mg/dL (ref 0.0–149.0)

## 2012-05-06 LAB — COMPREHENSIVE METABOLIC PANEL
ALT: 24 U/L (ref 0–53)
AST: 23 U/L (ref 0–37)
Albumin: 3.7 g/dL (ref 3.5–5.2)
Calcium: 9.2 mg/dL (ref 8.4–10.5)
Chloride: 105 mEq/L (ref 96–112)
Potassium: 4.2 mEq/L (ref 3.5–5.1)
Sodium: 137 mEq/L (ref 135–145)

## 2012-05-06 LAB — HEMOGLOBIN A1C: Hgb A1c MFr Bld: 5.5 % (ref 4.6–6.5)

## 2012-05-06 LAB — TSH: TSH: 1.55 u[IU]/mL (ref 0.35–5.50)

## 2012-05-06 MED ORDER — LISINOPRIL 10 MG PO TABS: 10.0000 mg | ORAL_TABLET | Freq: Every day | ORAL | Status: DC

## 2012-05-06 MED ORDER — ATORVASTATIN CALCIUM 10 MG PO TABS: 10.0000 mg | ORAL_TABLET | Freq: Every day | ORAL | Status: DC

## 2012-05-06 MED ORDER — HYDROCHLOROTHIAZIDE 25 MG PO TABS: 25.0000 mg | ORAL_TABLET | Freq: Every day | ORAL | Status: DC

## 2012-05-06 NOTE — Assessment & Plan Note (Addendum)
Td 07 Had a flu shot   shingles shot today S/p 3 Cscopes ---> Last Cscope 04-2010, serratous adenoma, next 5 years  diet exercise discussed  Labs

## 2012-05-06 NOTE — Patient Instructions (Addendum)
Check the  blood pressure 2 or 3 times a week, be sure it is between 110/60 and 130/85. If it is consistently higher or lower, let me know. Next visit in one year for another physical exam

## 2012-05-06 NOTE — Assessment & Plan Note (Signed)
Base on last cholesterol panel, we tried to switch him to Crestor but his insurance did not approve change. Will check FLP today, good compliance with Lipitor. Consider change his dosing.

## 2012-05-06 NOTE — Assessment & Plan Note (Signed)
Well-controlled, no change, RF

## 2012-05-06 NOTE — Assessment & Plan Note (Signed)
Very mild hyperglycemia, check A1c. Followup in one year if A1c is stable

## 2012-05-06 NOTE — Progress Notes (Signed)
  Subjective:    Patient ID: Tyler Madden, male    DOB: 1950-10-18, 62 y.o.   MRN: [id number]  HPI CPX  Past Medical History  Diagnosis Date  . Hyperlipidemia   . Hypertension   . ED (erectile dysfunction)   . Diabetes mellitus, type II      2011---> A1C 6.2     Past Surgical History  Procedure Date  . Vasectomy   . Hydrocele excision    Family History:  DM - M, F  HTN - M  stroke - no  CAD - M had a CABG @ 62 y/o  colon Ca - no  prostate Ca - no  F - deceased cirrhosis (+ETOH abuse)   Social History:  Married, children --2 daughters  Never Smoked  Alcohol use-yes  Drug use-no  Regular exercise- -yes, walks the dog 3-4/week, ~ 60 min. Occ. goes to the "[employer]".  Occupation: Airline pilot, machinery  Review of Systems Feeling well Denies chest pain or shortness of breath No nausea, vomiting, diarrhea. No dysuria gross hematuria. No difficulty urinating. Denies anxiety or depression.     Objective:   Physical Exam General -- alert, well-developed, and well-nourished.   Neck --no thyromegaly , normal carotid pulse Lungs -- normal respiratory effort, no intercostal retractions, no accessory muscle use, and normal breath sounds.   Heart-- normal rate, regular rhythm, no murmur, and no gallop.   Abdomen--soft, non-tender, no distention, no masses, no HSM, no guarding, and no rigidity. No bruit  Extremities-- no pretibial edema bilaterally Rectal-- No external abnormalities noted. Normal sphincter tone. No rectal masses or tenderness. Brown stool  Prostate:  Prostate gland firm and smooth, no enlargement, nodularity, tenderness, mass, asymmetry or induration. Neurologic-- alert & oriented X3 and strength normal in all extremities. Psych-- Cognition and judgment appear intact. Alert and cooperative with normal attention span and concentration.  not anxious appearing and not depressed appearing.      Assessment & Plan:

## 2012-05-07 ENCOUNTER — Encounter: Payer: Self-pay | Admitting: Internal Medicine

## 2012-05-09 ENCOUNTER — Telehealth: Payer: Self-pay | Admitting: Internal Medicine

## 2012-05-09 NOTE — Telephone Encounter (Signed)
See below sent via my chart Appointment Request From: [name]      With Provider: Willow Ora, MD [-Primary Care Physician-]      Preferred Date Range: From 05/09/2012 To 09/30/2012      Preferred Times: Mon Morning      Reason: To address the following health maintenance concerns.   Colonoscopy      Comments:   Dr. Drue Novel,   I do not think I am overdue for a Colonoscopy as MyChart suggests but thought I would request an appointment just in case. I believe my last one was in January 2012. Am I really overdue?      Thanks,      Tyler Madden

## 2012-05-10 NOTE — Telephone Encounter (Signed)
Called [facility] & they stated the pt is due for a colonoscopy 11/2012. I called, spoke to the pt & notified him.

## 2012-12-26 HISTORY — PX: CARDIAC CATHETERIZATION: SHX172

## 2013-01-09 ENCOUNTER — Encounter (HOSPITAL_COMMUNITY): Payer: Self-pay | Admitting: *Deleted

## 2013-01-09 ENCOUNTER — Inpatient Hospital Stay (HOSPITAL_COMMUNITY)
Admission: EM | Admit: 2013-01-09 | Discharge: 2013-01-12 | DRG: 550 | Disposition: A | Discharge status: Home / Self Care | Payer: BC Managed Care – PPO | Attending: Cardiology | Admitting: Cardiology

## 2013-01-09 ENCOUNTER — Encounter (HOSPITAL_COMMUNITY)
Admission: EM | Disposition: A | Discharge status: Home / Self Care | Payer: BC Managed Care – PPO | Source: Home / Self Care | Attending: Cardiology

## 2013-01-09 ENCOUNTER — Emergency Department (HOSPITAL_COMMUNITY): Payer: BC Managed Care – PPO

## 2013-01-09 DIAGNOSIS — I4901 Ventricular fibrillation: Secondary | ICD-10-CM | POA: Diagnosis present

## 2013-01-09 DIAGNOSIS — N179 Acute kidney failure, unspecified: Secondary | ICD-10-CM | POA: Diagnosis present

## 2013-01-09 DIAGNOSIS — I499 Cardiac arrhythmia, unspecified: Secondary | ICD-10-CM

## 2013-01-09 DIAGNOSIS — N289 Disorder of kidney and ureter, unspecified: Secondary | ICD-10-CM

## 2013-01-09 DIAGNOSIS — R7401 Elevation of levels of liver transaminase levels: Secondary | ICD-10-CM | POA: Diagnosis present

## 2013-01-09 DIAGNOSIS — F1010 Alcohol abuse, uncomplicated: Secondary | ICD-10-CM | POA: Diagnosis present

## 2013-01-09 DIAGNOSIS — E785 Hyperlipidemia, unspecified: Secondary | ICD-10-CM | POA: Diagnosis present

## 2013-01-09 DIAGNOSIS — R7402 Elevation of levels of lactic acid dehydrogenase (LDH): Secondary | ICD-10-CM | POA: Diagnosis present

## 2013-01-09 DIAGNOSIS — Z9289 Personal history of other medical treatment: Secondary | ICD-10-CM

## 2013-01-09 DIAGNOSIS — I10 Essential (primary) hypertension: Secondary | ICD-10-CM | POA: Diagnosis present

## 2013-01-09 DIAGNOSIS — Z87891 Personal history of nicotine dependence: Secondary | ICD-10-CM

## 2013-01-09 DIAGNOSIS — I214 Non-ST elevation (NSTEMI) myocardial infarction: Principal | ICD-10-CM | POA: Diagnosis present

## 2013-01-09 DIAGNOSIS — E119 Type 2 diabetes mellitus without complications: Secondary | ICD-10-CM | POA: Diagnosis present

## 2013-01-09 DIAGNOSIS — K7030 Alcoholic cirrhosis of liver without ascites: Secondary | ICD-10-CM | POA: Diagnosis present

## 2013-01-09 DIAGNOSIS — I2510 Atherosclerotic heart disease of native coronary artery without angina pectoris: Secondary | ICD-10-CM | POA: Diagnosis present

## 2013-01-09 DIAGNOSIS — R7303 Prediabetes: Secondary | ICD-10-CM | POA: Diagnosis present

## 2013-01-09 DIAGNOSIS — Z951 Presence of aortocoronary bypass graft: Secondary | ICD-10-CM

## 2013-01-09 DIAGNOSIS — I469 Cardiac arrest, cause unspecified: Secondary | ICD-10-CM | POA: Diagnosis present

## 2013-01-09 DIAGNOSIS — E876 Hypokalemia: Secondary | ICD-10-CM | POA: Diagnosis present

## 2013-01-09 DIAGNOSIS — N529 Male erectile dysfunction, unspecified: Secondary | ICD-10-CM | POA: Diagnosis present

## 2013-01-09 HISTORY — DX: Disorder of kidney and ureter, unspecified: N28.9

## 2013-01-09 HISTORY — DX: Cardiac arrest, cause unspecified: I46.9

## 2013-01-09 HISTORY — PX: PERCUTANEOUS CORONARY STENT INTERVENTION (PCI-S): SHX5485

## 2013-01-09 HISTORY — DX: Hypokalemia: E87.6

## 2013-01-09 HISTORY — PX: LEFT HEART CATHETERIZATION WITH CORONARY ANGIOGRAM: SHX5451

## 2013-01-09 HISTORY — DX: Atherosclerotic heart disease of native coronary artery without angina pectoris: I25.10

## 2013-01-09 LAB — URINALYSIS, ROUTINE W REFLEX MICROSCOPIC
Bilirubin Urine: NEGATIVE
Hgb urine dipstick: NEGATIVE
Ketones, ur: NEGATIVE mg/dL
Specific Gravity, Urine: 1.009 (ref 1.005–1.030)
pH: 6.5 (ref 5.0–8.0)

## 2013-01-09 LAB — CBC WITH DIFFERENTIAL/PLATELET
Eosinophils Absolute: 0.1 10*3/uL (ref 0.0–0.7)
Hemoglobin: 14.1 g/dL (ref 13.0–17.0)
Lymphs Abs: 1.9 10*3/uL (ref 0.7–4.0)
MCH: 29.7 pg (ref 26.0–34.0)
Neutro Abs: 7 10*3/uL (ref 1.7–7.7)
Neutrophils Relative %: 71 % (ref 43–77)
Platelets: 209 10*3/uL (ref 150–400)
RBC: 4.74 MIL/uL (ref 4.22–5.81)
WBC: 9.9 10*3/uL (ref 4.0–10.5)

## 2013-01-09 LAB — COMPREHENSIVE METABOLIC PANEL
ALT: 79 U/L — ABNORMAL HIGH (ref 0–53)
Albumin: 4 g/dL (ref 3.5–5.2)
Alkaline Phosphatase: 56 U/L (ref 39–117)
Chloride: 103 mEq/L (ref 96–112)
Glucose, Bld: 168 mg/dL — ABNORMAL HIGH (ref 70–99)
Potassium: 3 mEq/L — ABNORMAL LOW (ref 3.5–5.1)
Sodium: 141 mEq/L (ref 135–145)
Total Bilirubin: 0.3 mg/dL (ref 0.3–1.2)
Total Protein: 6.8 g/dL (ref 6.0–8.3)

## 2013-01-09 LAB — POCT I-STAT TROPONIN I

## 2013-01-09 SURGERY — LEFT HEART CATHETERIZATION WITH CORONARY ANGIOGRAM
Anesthesia: LOCAL

## 2013-01-09 MED ORDER — HEPARIN (PORCINE) IN NACL 2-0.9 UNIT/ML-% IJ SOLN
INTRAMUSCULAR | Status: AC
  Filled 2013-01-09: qty 1000

## 2013-01-09 MED ORDER — SODIUM CHLORIDE 0.9 % IV SOLN: INTRAVENOUS | Status: AC

## 2013-01-09 MED ORDER — MIDAZOLAM HCL 2 MG/2ML IJ SOLN
INTRAMUSCULAR | Status: AC
  Filled 2013-01-09: qty 2

## 2013-01-09 MED ORDER — NITROGLYCERIN 0.2 MG/ML ON CALL CATH LAB
INTRAVENOUS | Status: AC
  Filled 2013-01-09: qty 1

## 2013-01-09 MED ORDER — VERAPAMIL HCL 2.5 MG/ML IV SOLN
INTRAVENOUS | Status: AC
  Filled 2013-01-09: qty 2

## 2013-01-09 MED ORDER — HEPARIN (PORCINE) IN NACL 100-0.45 UNIT/ML-% IJ SOLN
1200.0000 [IU]/h | INTRAMUSCULAR | Status: DC
  Filled 2013-01-09: qty 250

## 2013-01-09 MED ORDER — ATROPINE SULFATE 1 MG/ML IJ SOLN
INTRAMUSCULAR | Status: AC
  Filled 2013-01-09: qty 1

## 2013-01-09 MED ORDER — HEPARIN BOLUS VIA INFUSION
4000.0000 [IU] | Freq: Once | INTRAVENOUS | Status: DC
  Filled 2013-01-09: qty 4000

## 2013-01-09 MED ORDER — HEPARIN SODIUM (PORCINE) 1000 UNIT/ML IJ SOLN
INTRAMUSCULAR | Status: AC
  Filled 2013-01-09: qty 1

## 2013-01-09 MED ORDER — ASPIRIN 81 MG PO CHEW
324.0000 mg | CHEWABLE_TABLET | Freq: Once | ORAL | Status: AC
  Administered 2013-01-09: 324 mg via ORAL
  Filled 2013-01-09: qty 4

## 2013-01-09 MED ORDER — POTASSIUM CHLORIDE CRYS ER 20 MEQ PO TBCR
40.0000 meq | EXTENDED_RELEASE_TABLET | Freq: Once | ORAL | Status: AC
  Administered 2013-01-09: 40 meq via ORAL
  Filled 2013-01-09: qty 2

## 2013-01-09 MED ORDER — TICAGRELOR 90 MG PO TABS
ORAL_TABLET | ORAL | Status: AC
  Filled 2013-01-09: qty 2

## 2013-01-09 MED ORDER — FENTANYL CITRATE 0.05 MG/ML IJ SOLN
INTRAMUSCULAR | Status: AC
  Filled 2013-01-09: qty 2

## 2013-01-09 MED ORDER — SODIUM CHLORIDE 0.9 % IV SOLN
1.0000 g | Freq: Once | INTRAVENOUS | Status: AC
  Administered 2013-01-09: 1 g via INTRAVENOUS
  Filled 2013-01-09: qty 10

## 2013-01-09 MED ORDER — ASPIRIN 81 MG PO CHEW: 324.0000 mg | CHEWABLE_TABLET | Freq: Once | ORAL | Status: DC

## 2013-01-09 MED ORDER — BIVALIRUDIN 250 MG IV SOLR
INTRAVENOUS | Status: AC
  Filled 2013-01-09: qty 250

## 2013-01-09 MED ORDER — LIDOCAINE HCL (PF) 1 % IJ SOLN
INTRAMUSCULAR | Status: AC
  Filled 2013-01-09: qty 30

## 2013-01-09 MED ORDER — SODIUM CHLORIDE 0.9 % IV SOLN
INTRAVENOUS | Status: DC
  Administered 2013-01-09: 21:00:00 via INTRAVENOUS

## 2013-01-09 NOTE — ED Notes (Signed)
Family at bedside. 

## 2013-01-09 NOTE — ED Notes (Signed)
Patient to cath lab at this time

## 2013-01-09 NOTE — Progress Notes (Signed)
Chaplain responded to page to come to ED for pt in Trauma [facility] who is to have cath.  Chaplain visited with pt, friend, daughter and pt's pastor.  Chaplain prayed with all who were present.  Pt expressed his gratitude for my visit.   01/12/2013 2200  Clinical Encounter Type  Visited With Patient and family together  Visit Type ED  Consult/Referral To Physician  Spiritual Encounters  Spiritual Needs Prayer;Emotional   Rulon Abide

## 2013-01-09 NOTE — Interval H&P Note (Signed)
History and Physical Interval Note:  02-01-13 10:42 PM  Tyler Madden  has presented today for surgery, with the diagnosis of stemi  The various methods of treatment have been discussed with the patient and family. After consideration of risks, benefits and other options for treatment, the patient has consented to  Procedure(s): LEFT HEART CATHETERIZATION WITH CORONARY ANGIOGRAM (N/A) as a surgical intervention .  The patient's history has been reviewed, patient examined, no change in status, stable for surgery.  I have reviewed the patient's chart and labs.  Questions were answered to the patient's satisfaction.    Cath Lab Visit (complete for each Cath Lab visit)  Clinical Evaluation Leading to the Procedure:   ACS: yes  Non-ACS:    Anginal Classification: CCS IV  Anti-ischemic medical therapy: No Therapy  Non-Invasive Test Results: No non-invasive testing performed  Prior CABG: No previous CABG         Tonny Bollman

## 2013-01-09 NOTE — H&P (Addendum)
Physician History and Physical    Tyler Madden MRN: [id number] DOB/AGE: 1950/10/04 62 y.o. Admit date: 01/16/2013  Primary Care Physician: Drue Novel Primary Cardiologist: New  HPI:  62 yo with history of HTN, hyperlipidemia, and diet-controlled diabetes was brought to the ER tonight after a cardiac arrest.  Patient reports that for the last couple of weeks, he has noted some exertional dyspnea when walking his dog.  No chest pain.  He was a little worried about the dyspnea.  Tonight, he was playing singles tennis and actually was feeling good with no dyspnea.  He played for around 1.5 hours and did not seem particularly tired.  His opponent noted him to suddenly bend over and then fall to the ground.  His eyes had "rolled in the back of his head."  He did not have a pulse.  CPR was started immediately.  EMS arrived and attached an AED.  The AED gave him one shock and his pulse came back.  He awoke soon after this.  He does not remember what was going on just before the arrest.  Currently, he has some mild central chest pain.  His ECG is abnormal (see below) but does not meet STEMI criteria.  Initial troponin negative but event was only about 30-45 minutes prior.  He is awake and alert, able to recall all events except for those just prior to his arrest.   Review of systems complete and found to be negative unless listed above   PMH: 1. HTN 2. Hyperlipidemia 3. Type II diabetes: Diet-controlled 4. Erectile dysfunction  Family History  Problem Relation Age of Onset  . Diabetes Mother   . Hypertension Mother   . Coronary artery disease Mother     CABG  . Diabetes Father   . Alcohol abuse Father     CIRRHOSIS  . Stroke Neg Hx   . Cancer Neg Hx   Sister: Possible sudden cardiac death   History   Social History  . Marital Status: Married    Spouse Name: N/A    Number of Children: N/A  . Years of Education: N/A   Occupational History  . Not on file.   Social History Main Topics  .  Smoking status: Former Smoker    Types: Cigarettes  . Smokeless tobacco: Never Used  . Alcohol Use: Not on file  . Drug Use: Not on file  . Sexual Activity: Not on file   Other Topics Concern  . Not on file   Social History Narrative  . No narrative on file      (Not in a hospital admission)  Physical Exam: Blood pressure 154/84, pulse 86, temperature 97.6 F (36.4 C), temperature source Oral, resp. rate 19, height 5\' 10"  (1.778 m), weight 190 lb (86.183 kg), SpO2 100.00%.  General: NAD Neck: No JVD, no thyromegaly or thyroid nodule.  Lungs: Clear to auscultation bilaterally with normal respiratory effort. CV: Nondisplaced PMI.  Heart regular S1/S2, no S3/S4, 1/6 SEM RUSB.  No peripheral edema.  No carotid bruit.  Normal pedal pulses.  Abdomen: Soft, nontender, no hepatosplenomegaly, no distention.  Skin: Intact without lesions or rashes.  Neurologic: Alert and oriented x 3.  Psych: Normal affect. Extremities: No clubbing or cyanosis.  HEENT: Normal.   Labs:   Lab Results  Component Value Date   WBC 9.9 01/12/2013   HGB 14.1 01/13/2013   HCT 38.9* 12/28/2012   MCV 82.1 12/26/2012   PLT 209 01/16/2013    Recent Labs  Lab 01/24/2013 2043  NA 141  K 3.0*  CL 103  CO2 23  BUN 25*  CREATININE 1.60*  CALCIUM 9.8  PROT 6.8  BILITOT 0.3  ALKPHOS 56  ALT 79*  AST 81*  GLUCOSE 168*   No results found for this basename: CKTOTAL, CKMB, CKMBINDEX, TROPONINI     Radiology: - CXR: No active disease  EKG: NSR, possible old ASMI, nonspecific inferior ST changes, anterolateral slight ST depression, normal QT interval  ASSESSMENT AND PLAN:  1. Cardiac arrest: Possible ventricular fibrillation/pulseless VT.  Patient had immediate CPR followed by shock from AED.  He regained his pulse and awoke after 1 shock.  He has mild chest pain currently, could be from AED discharge.  Initial troponin negative but only 30-45 minutes post-arrest.  He has a number of risk factors for coronary  disease (HTN, DM, hyperlipidemia), and interestingly, his sister had what sounds like sudden cardiac death at age 42.  - Will give ASA, start heparin gtt.  - Plan for urgent coronary angiography tonight to assess for/treat CAD - Will need echo in am.  - Further workup based on results of angiography.  2. Hypokalemia: Normal K in the past.  Could be related to a degree of dehydration with exertion this evening.  Replete K now.  Hold HCTZ. 3. AKI: Creatinine 1.6, this is post-arrest and may be related to hypoperfusion.  Limit contrast with cath. Hold lisinopril for now.   Marca Ancona 12/26/2012 10:09 PM

## 2013-01-09 NOTE — Progress Notes (Signed)
ANTICOAGULATION CONSULT NOTE - Initial Consult  Pharmacy Consult:  Heparin Indication:   ACS  Allergies  Allergen Reactions  . Codeine     REACTION: nausea  . Tadalafil     REACTION: sob    Patient Measurements: Height: 5\' 10"  (177.8 cm) Weight: 190 lb (86.183 kg) IBW/kg (Calculated) : 73 Heparin Dosing Weight: 86 kg  Vital Signs: Temp: 97.6 F (36.4 C) (09/15 2059) Temp src: Oral (09/15 2059) BP: 136/87 mmHg (09/15 2115) Pulse Rate: 83 (09/15 2115)  Labs:  Recent Labs  01-28-2013 2043  HGB 14.1  HCT 38.9*  PLT 209  CREATININE 1.60*    Estimated Creatinine Clearance: 48.2 ml/min (by C-G formula based on Cr of 1.6).   Medical History: Past Medical History  Diagnosis Date  . Hyperlipidemia   . Hypertension   . ED (erectile dysfunction)   . Diabetes mellitus, type II      2011---> A1C 6.2         Assessment: 37 YOM who passed out and hit his head while playing tennis.  Patient is s/p CPR and shock, and Pharmacy consulted to start IV heparin for ACS.  He is not on any anticoagulation PTA.  Baseline labs reviewed.   Goal of Therapy:  Heparin level 0.3-0.7 units/ml Monitor platelets by anticoagulation protocol: Yes    Plan:  - Heparin 4000 units IV bolus x 1, then - Heparin gtt at 1200 units/hr - Check 6 hr HL - Daily HL / CBC - F/U KCL supplementation    Aira Sallade D. Laney Potash, PharmD, BCPS Pager:  [phone] Jan 28, 2013, 9:47 PM

## 2013-01-09 NOTE — ED Provider Notes (Signed)
CSN: [id number]     Arrival date & time 01/23/2013  2035 History   First MD Initiated Contact with Patient 01/06/2013 2057     Chief Complaint  Patient presents with  . Cardiac Arrest    Rosc pta    (Consider location/radiation/quality/duration/timing/severity/associated sxs/prior Treatment) HPI Comments: Tyler Madden is a 62 y.o. Male who presents after resuscitation for evaluation. The patient was reportedly playing tennis when he collapsed. He struck his head. He was unconscious. He received bystander CPR. EMS arrived, and an automated defibrillator was placed, on him. Device shock. He received a single defibrillation. CPR was continued until the paramedics arrived. Paramedics Doppler pulse check in her pulse. He was in sinus rhythm. He had a normal blood pressure. He was transferred here without additional treatment. The patient did not receive and advanced airway at any time. Duration of CPR is not clear. Rhythm for shock is not clear. The patient cannot recall the events. The patient is somewhat confused.  Level V caveat: Confusion  The history is provided by the patient.    Past Medical History  Diagnosis Date  . Hyperlipidemia   . Hypertension   . ED (erectile dysfunction)   . Diabetes mellitus, type II      2011---> A1C 6.2     Past Surgical History  Procedure Laterality Date  . Vasectomy    . Hydrocele excision     Family History  Problem Relation Age of Onset  . Diabetes Mother   . Hypertension Mother   . Coronary artery disease Mother     CABG  . Diabetes Father   . Alcohol abuse Father     CIRRHOSIS  . Stroke Neg Hx   . Cancer Neg Hx    History  Substance Use Topics  . Smoking status: Former Smoker    Types: Cigarettes  . Smokeless tobacco: Never Used  . Alcohol Use: Not on file    Review of Systems  Unable to perform ROS   Allergies  Codeine and Tadalafil  Home Medications   Current Outpatient Rx  Name  Route  Sig  Dispense  Refill  . aspirin 81  MG EC tablet   Oral   Take 81 mg by mouth daily.           Marland Kitchen atorvastatin (LIPITOR) 10 MG tablet   Oral   Take 1 tablet (10 mg total) by mouth daily.   90 tablet   3   . hydrochlorothiazide (HYDRODIURIL) 25 MG tablet   Oral   Take 1 tablet (25 mg total) by mouth daily.   90 tablet   3   . lisinopril (PRINIVIL,ZESTRIL) 10 MG tablet   Oral   Take 1 tablet (10 mg total) by mouth daily.   90 tablet   3    BP 136/87  Pulse 83  Temp(Src) 97.6 F (36.4 C) (Oral)  Resp 24  Ht 5\' 10"  (1.778 m)  Wt 190 lb (86.183 kg)  BMI 27.26 kg/m2  SpO2 100% Physical Exam  Nursing note and vitals reviewed. Constitutional: He appears well-developed and well-nourished.  HENT:  Head: Normocephalic and atraumatic.  Right Ear: External ear normal.  Left Ear: External ear normal.  Eyes: Conjunctivae and EOM are normal. Pupils are equal, round, and reactive to light.  Neck: Normal range of motion and phonation normal. Neck supple.  Cardiovascular: Normal rate, regular rhythm, normal heart sounds and intact distal pulses.   Pulmonary/Chest: Effort normal and breath sounds normal. He exhibits  tenderness (mild anterior chest wall tenderness, without crepitation or deformity). He exhibits no bony tenderness.  Abdominal: Soft. Normal appearance. He exhibits no distension. There is no tenderness.  Musculoskeletal: Normal range of motion.  Neurological: He is alert. He has normal strength. No cranial nerve deficit or sensory deficit. He exhibits normal muscle tone. Coordination normal.  Not oriented to place or time. He cannot recall the events preceding the syncopal event.  Skin: Skin is warm, dry and intact.  Psychiatric: He has a normal mood and affect. His behavior is normal. Judgment and thought content normal.    ED Course  Procedures (including critical care time) Medications  0.9 %  sodium chloride infusion ( Intravenous New Bag/Given 12/27/2012 2111)  aspirin chewable tablet 324 mg (not  administered)  calcium gluconate 1 g in sodium chloride 0.9 % 100 mL IVPB (0 g Intravenous Stopped 01/24/2013 2142)    Patient Vitals for the past 24 hrs:  BP Temp Temp src Pulse Resp SpO2 Height Weight  01/17/2013 2115 136/87 mmHg - - 83 24 100 % - -  01/08/2013 2059 120/76 mmHg 97.6 F (36.4 C) Oral - 16 100 % - -  12/26/2012 2045 121/80 mmHg - - - 21 100 % 5\' 10"  (1.778 m) 190 lb (86.183 kg)     8:58  PM-Consult complete with Dr. Shirlee Latch- cardiology service. Patient case explained and discussed. He agrees to admit patient for further evaluation and treatment. Call ended at 2100  Date: 01/08/14- 2046  Rate: 77  Rhythm: normal sinus rhythm  QRS Axis: normal  PR and QT Intervals: normal  ST/T Wave abnormalities: Mild lateral depression  PR and QRS Conduction Disutrbances:none  Narrative Interpretation:   Old EKG Reviewed: none available   Date: 01/08/14- 2110  Rate: 86  Rhythm: normal sinus rhythm  QRS Axis: normal  PR and QT Intervals: normal  ST/T Wave abnormalities: Possible mild lateral ST depression  PR and QRS Conduction Disutrbances:none  Narrative Interpretation:   Old EKG Reviewed: unchanged- from earlier today    CRITICAL CARE Performed by: Mancel Bale L Total critical care time:45 minutes Critical care time was exclusive of separately billable procedures and treating other patients. Critical care was necessary to treat or prevent imminent or life-threatening deterioration. Critical care was time spent personally by me on the following activities: development of treatment plan with patient and/or surrogate as well as nursing, discussions with consultants, evaluation of patient's response to treatment, examination of patient, obtaining history from patient or surrogate, ordering and performing treatments and interventions, ordering and review of laboratory studies, ordering and review of radiographic studies, pulse oximetry and re-evaluation of patient's  condition.     Labs Review Labs Reviewed  CBC WITH DIFFERENTIAL - Abnormal; Notable for the following:    HCT 38.9 (*)    MCHC 36.2 (*)    All other components within normal limits  COMPREHENSIVE METABOLIC PANEL - Abnormal; Notable for the following:    Potassium 3.0 (*)    Glucose, Bld 168 (*)    BUN 25 (*)    Creatinine, Ser 1.60 (*)    AST 81 (*)    ALT 79 (*)    GFR calc non Af Amer 44 (*)    GFR calc Af Amer 51 (*)    All other components within normal limits  PRO B NATRIURETIC PEPTIDE  URINALYSIS, ROUTINE W REFLEX MICROSCOPIC  POCT I-STAT TROPONIN I   Imaging Review Dg Chest Portable 1 View  01/04/2013   *RADIOLOGY REPORT*  Clinical Data: Cardiac arrest  PORTABLE CHEST - 1 VIEW  Comparison: None.  Findings: Low lung volumes.  Lungs are grossly clear.  Upper normal heart size.  Normal vascularity.  No pneumothorax.  IMPRESSION: No active cardiopulmonary disease.   Original Report Authenticated By: Jolaine Click, M.D.    MDM   1. Sudden cardiac death   2. Cardiac arrhythmia   3. Successful cardiopulmonary resuscitation    Apparent cardiac arrhythmia leading to sudden cardiac death with successful cardiopulmonary resuscitation after AED shock. No evident cause for cardiac arrhythmia on history or ED evaluation. The patient needs cardiac assessment by cardiology in the emergency department and likely cardiac catheterization to look for a source for the cardiac arrest.   Nursing Notes Reviewed/ Care Coordinated, and agree without changes. Applicable Imaging Reviewed.  Interpretation of Laboratory Data incorporated into ED treatment  Plan: Admit   Flint Melter, MD 01/20/2013 2145

## 2013-01-09 NOTE — ED Notes (Signed)
62 y/o patient playing tennis when he passed out and hit head, patient had loss of pulse at that time, cpr initiated by people on scene, [facility]FD on scene with aed and delivered one shock, patient then with rosc, patient c/o centralized chest pain due to cpr

## 2013-01-10 DIAGNOSIS — I059 Rheumatic mitral valve disease, unspecified: Secondary | ICD-10-CM

## 2013-01-10 DIAGNOSIS — I214 Non-ST elevation (NSTEMI) myocardial infarction: Secondary | ICD-10-CM

## 2013-01-10 LAB — MRSA PCR SCREENING: MRSA by PCR: NEGATIVE

## 2013-01-10 LAB — CBC
HCT: 35.5 % — ABNORMAL LOW (ref 39.0–52.0)
Hemoglobin: 12.9 g/dL — ABNORMAL LOW (ref 13.0–17.0)
RBC: 4.32 MIL/uL (ref 4.22–5.81)
WBC: 10 10*3/uL (ref 4.0–10.5)

## 2013-01-10 LAB — BASIC METABOLIC PANEL
Chloride: 102 mEq/L (ref 96–112)
Creatinine, Ser: 1.26 mg/dL (ref 0.50–1.35)
GFR calc Af Amer: 68 mL/min — ABNORMAL LOW (ref >90)
Potassium: 3.3 mEq/L — ABNORMAL LOW (ref 3.5–5.1)

## 2013-01-10 LAB — LIPID PANEL
LDL Cholesterol: 96 mg/dL (ref 0–99)
VLDL: 22 mg/dL (ref 0–40)

## 2013-01-10 LAB — TROPONIN I: Troponin I: 7.8 ng/mL (ref <0.30)

## 2013-01-10 MED ORDER — ONDANSETRON HCL 4 MG/2ML IJ SOLN: 4.0000 mg | Freq: Four times a day (QID) | INTRAMUSCULAR | Status: DC | PRN

## 2013-01-10 MED ORDER — TICAGRELOR 90 MG PO TABS
90.0000 mg | ORAL_TABLET | Freq: Two times a day (BID) | ORAL | Status: DC
  Administered 2013-01-10 – 2013-01-12 (×5): 90 mg via ORAL
  Filled 2013-01-10 (×7): qty 1

## 2013-01-10 MED ORDER — ASPIRIN EC 81 MG PO TBEC
81.0000 mg | DELAYED_RELEASE_TABLET | Freq: Every day | ORAL | Status: DC
  Administered 2013-01-10 – 2013-01-12 (×3): 81 mg via ORAL
  Filled 2013-01-10 (×3): qty 1

## 2013-01-10 MED ORDER — ASPIRIN 300 MG RE SUPP
300.0000 mg | RECTAL | Status: DC
  Filled 2013-01-10: qty 1

## 2013-01-10 MED ORDER — METOPROLOL TARTRATE 25 MG PO TABS
25.0000 mg | ORAL_TABLET | Freq: Two times a day (BID) | ORAL | Status: DC
  Administered 2013-01-10 – 2013-01-11 (×3): 25 mg via ORAL
  Filled 2013-01-10 (×4): qty 1

## 2013-01-10 MED ORDER — ACETAMINOPHEN 325 MG PO TABS
650.0000 mg | ORAL_TABLET | ORAL | Status: DC | PRN
  Administered 2013-01-11 – 2013-01-12 (×3): 650 mg via ORAL
  Filled 2013-01-10 (×8): qty 2

## 2013-01-10 MED ORDER — DIAZEPAM 5 MG PO TABS: 5.0000 mg | ORAL_TABLET | ORAL | Status: DC | PRN

## 2013-01-10 MED ORDER — ACETAMINOPHEN 325 MG PO TABS
650.0000 mg | ORAL_TABLET | ORAL | Status: DC | PRN
  Administered 2013-01-10 – 2013-01-11 (×5): 650 mg via ORAL

## 2013-01-10 MED ORDER — SODIUM CHLORIDE 0.9 % IJ SOLN
3.0000 mL | Freq: Two times a day (BID) | INTRAMUSCULAR | Status: DC
  Administered 2013-01-10 – 2013-01-11 (×2): 3 mL via INTRAVENOUS

## 2013-01-10 MED ORDER — NITROGLYCERIN 0.4 MG SL SUBL: 0.4000 mg | SUBLINGUAL_TABLET | SUBLINGUAL | Status: DC | PRN

## 2013-01-10 MED ORDER — SODIUM CHLORIDE 0.9 % IV SOLN
1.0000 mL/kg/h | INTRAVENOUS | Status: AC
  Administered 2013-01-10: 1 mL/kg/h via INTRAVENOUS

## 2013-01-10 MED ORDER — SODIUM CHLORIDE 0.9 % IJ SOLN: 3.0000 mL | INTRAMUSCULAR | Status: DC | PRN

## 2013-01-10 MED ORDER — ASPIRIN 81 MG PO CHEW: 324.0000 mg | CHEWABLE_TABLET | ORAL | Status: DC

## 2013-01-10 MED ORDER — TICAGRELOR 90 MG PO TABS: 90.0000 mg | ORAL_TABLET | Freq: Two times a day (BID) | ORAL | Status: DC

## 2013-01-10 MED ORDER — SODIUM CHLORIDE 0.9 % IV SOLN: 250.0000 mL | INTRAVENOUS | Status: DC | PRN

## 2013-01-10 MED ORDER — ATORVASTATIN CALCIUM 80 MG PO TABS
80.0000 mg | ORAL_TABLET | Freq: Every day | ORAL | Status: DC
  Administered 2013-01-10 – 2013-01-12 (×3): 80 mg via ORAL
  Filled 2013-01-10 (×3): qty 1

## 2013-01-10 MED FILL — Sodium Chloride IV Soln 0.9%: INTRAVENOUS | Qty: 50 | Status: AC

## 2013-01-10 NOTE — Progress Notes (Signed)
    Subjective:  Chest is sore - he thinks secondary to CPR. No other complaints. No dyspnea.   Objective:  Vital Signs in the last 24 hours: Temp:  [97.6 F (36.4 C)-98.6 F (37 C)] 98.6 F (37 C) (09/16 0400) Pulse Rate:  [67-86] 67 (09/16 0700) Resp:  [14-24] 20 (09/16 0700) BP: (111-154)/(64-111) 121/68 mmHg (09/16 0700) SpO2:  [97 %-100 %] 98 % (09/16 0500) Weight:  [190 lb (86.183 kg)-200 lb 13.4 oz (91.1 kg)] 200 lb 13.4 oz (91.1 kg) (09/16 0045)  Intake/Output from previous day: 09/15 0701 - 09/16 0700 In: 855.8 [P.O.:240; I.V.:615.8] Out: 950 [Urine:950]  Physical Exam: Pt is alert and oriented, NAD HEENT: normal Neck: JVP - normal, carotids 2+= without bruits Lungs: CTA bilaterally CV: RRR without murmur or gallop Abd: soft, NT, Positive BS, no hepatomegaly Ext: no C/C/E, distal pulses intact and equal, right radial site clear Skin: warm/dry no rash  Lab Results:  Recent Labs  12/26/2012 2043 12/28/2012 0300  WBC 9.9 10.0  HGB 14.1 12.9*  PLT 209 187    Recent Labs  01/11/2013 2043 01/16/2013 0300  NA 141 137  K 3.0* 3.3*  CL 103 102  CO2 23 23  GLUCOSE 168* 125*  BUN 25* 20  CREATININE 1.60* 1.26    Recent Labs  12/31/2012 0300  TROPONINI 7.80*    Cardiac Studies: Cath as noted. 2D Echo pending.  CXR: Findings: Low lung volumes. Lungs are grossly clear. Upper normal  heart size. Normal vascularity. No pneumothorax.  IMPRESSION:  No active cardiopulmonary disease.  Original Report Authenticated By: Jolaine Click, M.D.  Tele: Sinus rhythm, personally reviewed.  Assessment/Plan:  1. OOH VF arrest. Bystander CPR, AED defibrillation 2. NSTEMI - occluded LCx and severe RCA stenosis, s/p PCI with DES 3. HTN 4. Hyperlipidemia 5. Hypokalemia 6. Type 2 DM  Plan: ASA, brilinta, high-dose atorvastatin. Add low-dose beta blocker today. Probably resume ACE in am if BP will tolerate. Check 2D echo today. Ok to walk with cardiac rehab today. Will need  dietary education. He is a nonsmoker.   Tonny Bollman, M.D. 12/26/2012, 7:38 AM

## 2013-01-10 NOTE — Progress Notes (Signed)
  Echocardiogram 2D Echocardiogram has been performed.  Tyler Madden 01/17/2013, 12:54 PM

## 2013-01-10 NOTE — Care Management Note (Signed)
    Page 1 of 1   12/30/2012     9:53:19 AM   CARE MANAGEMENT NOTE 01/16/2013  Patient:  Tyler Madden, Tyler Madden   Account Number:  [id number]  Date Initiated:  01/21/2013  Documentation initiated by:  Junius Creamer  Subjective/Objective Assessment:   adm w mi     Action/Plan:   lives w wife, pcp dr Elita Quick paz   Anticipated DC Date:     Anticipated DC Plan:        DC Planning Services  CM consult  Medication Assistance      Choice offered to / List presented to:             Status of service:   Medicare Important Message given?   (If response is "NO", the following Medicare IM given date fields will be blank) Date Medicare IM given:   Date Additional Medicare IM given:    Discharge Disposition:    Per UR Regulation:  Reviewed for med. necessity/level of care/duration of stay  If discussed at Long Length of Stay Meetings, dates discussed:    Comments:  9/16 0844 debbie Rowynn Mcweeney rn,bsn gave pt brilinta 30day free and copay assist card.

## 2013-01-10 NOTE — CV Procedure (Signed)
Cardiac Catheterization Procedure Note  Name: Tyler Madden MRN: [id number] DOB: [birth year]  Procedure: Left Heart Cath, Selective Coronary Angiography, PTCA and stenting of the distal left circumflex, PTCA and stenting of the mid RCA, PTCA and stenting of the distal RCA into the PLA branch  Indication: Out of hospital ventricular fibrillation arrest, ongoing chest pain, suspected non-ST elevation infarction  Procedural Details:  The right wrist was prepped, draped, and anesthetized with 1% lidocaine. Using the modified Seldinger technique, a 5/6 French slender sheath was introduced into the right radial artery. 3 mg of verapamil was administered through the sheath, weight-based unfractionated heparin was administered intravenously. Standard Judkins catheters were used for selective coronary angiography and left ventriculography. Catheter exchanges were performed over an exchange length guidewire.  PROCEDURAL FINDINGS Hemodynamics: AO 114/70 with a mean of 91 LV 116/21   Coronary angiography: Coronary dominance: right  Left mainstem: Arises from the left. Divides into the LAD and left circumflex. Mild irregularity in the mid left main but no significant stenosis. There is mild calcification.  Left anterior descending (LAD): Large-caliber vessel that reaches the left ventricular apex. There is a large first diagonal branch without significant stenosis. There is mild calcification present in the LAD. There are no significant stenoses.  Left circumflex (LCx): There is a moderate to large caliber intermediate branch without significant stenosis. The AV circumflex is patent. The first obtuse marginal has 50% stenosis. The distal AV groove circumflex is totally occluded. There is TIMI 1 filling beyond the area of occlusion.  Right coronary artery (RCA): The RCA is dominant. The vessel has a high, anterior origin. It is injected with an AL-1 catheter. There is diffuse segmental stenosis  throughout the mid vessel leading into a 95% stenosis at the junction of the mid and distal vessel. The distal RCA has diffuse 70% stenosis leading into a 95% stenosis into the PLA branch.  Left ventriculography: Deferred because of contrast volume used for multivessel PCI.  PCI Note:  Following the diagnostic procedure, the decision was made to proceed with PCI. He was loaded with brilinta 180 mg. I elected to proceed with PCI of both left circumflex and right coronary artery because of critical stenoses in both vessels. The LAD and left main were free of significant disease. Weight-based bivalirudin was given for anticoagulation. Once a therapeutic ACT was achieved, a 6 [place] XB 3.5 cm guide catheter was inserted.  A BMW coronary guidewire was used to cross the lesion.  The lesion was predilated with a 2.0 mm balloon.  The lesion was then stented with a 2.5 x 28 mm Promus Premier drug-eluting stent.  The stent was postdilated with a 2.75 mm noncompliant balloon.  Following PCI, there was 0% residual stenosis and TIMI-3 flow.   Attention was then turned to the RCA. An AL-1 guide catheter was utilized. The same BMW wire was used to cross both lesions. The mid lesion and distal lesion were both predilated with the same 2.0 mm balloon to 10 atmospheres. The distal lesion was stented with a 2.5 x 24 mm Promus Premier drug-eluting stent which was deployed at 12 atmospheres. It was postdilated with a 2.75 x 15 mm noncompliant balloon to 14 atmospheres on 2 inflations. The mid lesion was stented with a 3.0 x 28 mm Promus Premier drug-eluting stent deployed at 14 atmospheres. That stent was postdilated with a 3.25 x 20 mm noncompliant balloon which was taken to 16 atmospheres on a single inflation. Final angiography confirmed an excellent result.  The patient tolerated the procedure well. There were no immediate procedural complications. A TR band was used for radial hemostasis. The patient was transferred to the  post catheterization recovery area for further monitoring.  PCI Data: Lesion 1: Vessel - LCx/Segment - distal Percent Stenosis (pre)  100 TIMI-flow 1 Stent 2.5x28 mm Promus DES Percent Stenosis (post) 0 TIMI-flow (post) 3  Lesion 2: Vessel - RCA/Segment - mid Percent Stenosis (pre) 95 TIMI-flow 3 Stent 3.0x28 mm Promus DES Percent Stenosis (post) 0 TIMI-flow (post) 3  Lesion 3: Vessel - RCA/Segment - distal Percent Stenosis (post) 95 TIMI-flow 3 Stent 2.5x24 mm Promus DES Percent Stenosis (post) 0 TIMI-flow 3  Final Conclusions:   1. Total occlusion of the distal left circumflex treated successfully with a drug-eluting stent 2. Severe stenoses of the mid and distal right coronary arteries treated successfully with drug-eluting stents 3. Widely patent left main, LAD, and diagonal branches 4. Out of hospital ventricular fibrillation arrest requiring CPR and defibrillation   Recommendations:  The patient is remarkably stable after his out of hospital arrest. He should be treated with aspirin and brilinta for at least 12 months. He will receive post MI medical therapy. Will check an echocardiogram to assess LV function.  Tonny Bollman 01/22/2013, 12:26 AM

## 2013-01-10 NOTE — Progress Notes (Addendum)
CARDIAC REHAB PHASE I   PRE:  Rate/Rhythm: 69 SR with 1 PAC    BP: sitting 121/70    SaO2:   MODE:  Ambulation: 170 ft   POST:  Rate/Rhythm: 84 SR with PACs/PVCs     BP: sitting 119/69     SaO2:   Pt c/o soreness in chest. Difficulty getting OOB but able to do independently. Pt had increased ectopy upon standing. This calmed down to NSR but then occurred with walking again. Occ PVCs at first with walking. Returned to NSR and did not occur again. Strips in hard chart. No c/o walking. Kept walk short. To recliner. Discussed MI/stents with pt and wife. Interested in CRPII and will send referral to [place]. Will continue to follow. [id number]   Elissa Lovett [place] CES, ACSM 01/18/2013 11:20 AM

## 2013-01-10 NOTE — Progress Notes (Signed)
Followed radial compression device procedure.  Started deflation at 0200, 90 minutes after procedure.  Withdrew 3cc of air in 15 minute increments until all air was removed.  Site was a level 0 throughout deflation with no oozing.  Left band in place for 1 hour and removed band at 0400.  Site is a level 0.  2x2 guaze and tegaderm applied to site.  Instructed patient to keep arm at heart level and to limit use of the arm.  Told patient to call me immediately if any bleeding were to occur.  Will continue to monitor. Dara Hoyer

## 2013-01-11 DIAGNOSIS — I214 Non-ST elevation (NSTEMI) myocardial infarction: Secondary | ICD-10-CM

## 2013-01-11 LAB — BASIC METABOLIC PANEL
CO2: 26 mEq/L (ref 19–32)
Calcium: 9.1 mg/dL (ref 8.4–10.5)
Glucose, Bld: 114 mg/dL — ABNORMAL HIGH (ref 70–99)
Potassium: 3.3 mEq/L — ABNORMAL LOW (ref 3.5–5.1)
Sodium: 140 mEq/L (ref 135–145)

## 2013-01-11 LAB — CBC
Hemoglobin: 12.2 g/dL — ABNORMAL LOW (ref 13.0–17.0)
MCH: 29.4 pg (ref 26.0–34.0)
RBC: 4.15 MIL/uL — ABNORMAL LOW (ref 4.22–5.81)

## 2013-01-11 MED ORDER — LISINOPRIL 2.5 MG PO TABS
2.5000 mg | ORAL_TABLET | Freq: Every day | ORAL | Status: DC
  Administered 2013-01-11 – 2013-01-12 (×2): 2.5 mg via ORAL
  Filled 2013-01-11 (×4): qty 1

## 2013-01-11 MED ORDER — METOPROLOL TARTRATE 12.5 MG HALF TABLET
12.5000 mg | ORAL_TABLET | Freq: Two times a day (BID) | ORAL | Status: DC
  Administered 2013-01-11 – 2013-01-12 (×2): 12.5 mg via ORAL
  Filled 2013-01-11 (×3): qty 1

## 2013-01-11 MED ORDER — POTASSIUM CHLORIDE CRYS ER 20 MEQ PO TBCR
20.0000 meq | EXTENDED_RELEASE_TABLET | Freq: Once | ORAL | Status: AC
  Administered 2013-01-11: 20 meq via ORAL
  Filled 2013-01-11: qty 1

## 2013-01-11 NOTE — Progress Notes (Signed)
CARDIAC REHAB PHASE I   PRE:  Rate/Rhythm: 56SB  BP:  Supine:   Sitting: 130/78  Standing:    SaO2:   MODE:  Ambulation: 700 ft   POST:  Rate/Rhythm: 68SR  BP:  Supine:   Sitting: 132/75  Standing:    SaO2:  1045-1140 Pt walked 700 ft with hand held asst. A little wobbly at times but no loss of balance. No ectopy noted during walk. Pt is sore getting up but once up, he was able to walk well. Education completed with pt and wife. Understanding voiced. Reviewed NTG use,MI restrictions, exercise guidelines, and carb counting.    Luetta Nutting, RN BSN  01/20/2013 11:34 AM

## 2013-01-11 NOTE — Progress Notes (Signed)
Attempted to call report to Rn on 3W; RN unable to take report at this time; left # for RN to return call when able to take report

## 2013-01-11 NOTE — Progress Notes (Signed)
Subjective:  Chest soreness from CPR. No dyspnea. No other complaints.  Objective:  Vital Signs in the last 24 hours: Temp:  [97.9 F (36.6 C)-99.4 F (37.4 C)] 98.6 F (37 C) (09/17 0712) Pulse Rate:  [69-87] 69 (09/16 2144) Resp:  [15-26] 25 (09/17 0712) BP: (99-132)/(53-79) 128/79 mmHg (09/17 0712) SpO2:  [95 %-98 %] 95 % (09/17 0712)  Intake/Output from previous day: 09/16 0701 - 09/17 0700 In: 1547.8 [P.O.:1200; I.V.:347.8] Out: -   Physical Exam: Pt is alert and oriented, NAD HEENT: normal Neck: JVP - normal Lungs: CTA bilaterally CV: RRR without murmur or gallop Abd: soft, NT, Positive BS, no hepatomegaly Ext: no C/C/E, distal pulses intact and equal Skin: warm/dry no rash   Lab Results:  Recent Labs  12/27/2012 0300 01/19/2013 0402  WBC 10.0 7.8  HGB 12.9* 12.2*  PLT 187 173    Recent Labs  01/06/2013 0300 01/20/2013 0402  NA 137 140  K 3.3* 3.3*  CL 102 105  CO2 23 26  GLUCOSE 125* 114*  BUN 20 15  CREATININE 1.26 1.33    Recent Labs  01/17/2013 0805 01/02/2013 1244  TROPONINI >20.00* >20.00*    Cardiac Studies: 2D Echo: Left ventricle: The cavity size was normal. Wall thickness was increased in a pattern of mild LVH. Systolic function was vigorous. The estimated ejection fraction was in the range of 65% to 70%. Wall motion was normal; there were no regional wall motion abnormalities. The transmitral flow pattern was normal. The deceleration time of the early transmitral flow velocity was normal. The pulmonary vein flow pattern was normal. The tissue Doppler parameters were normal. Left ventricular diastolic function parameters were normal.  ------------------------------------------------------------ Aortic valve: Trileaflet; mildly thickened leaflets. Doppler: There was no stenosis. No significant regurgitation.  ------------------------------------------------------------ Aorta: The aorta was normal, not dilated, and  non-diseased.  ------------------------------------------------------------ Mitral valve: Calcified annulus. Doppler: Mild regurgitation. Peak gradient: 3mm Hg (D).  ------------------------------------------------------------ Left atrium: The atrium was normal in size.  ------------------------------------------------------------ Right ventricle: The cavity size was mildly dilated. Systolic function was normal.  ------------------------------------------------------------ Pulmonic valve: The valve appears to be grossly normal. Doppler: Trivial regurgitation.  ------------------------------------------------------------ Tricuspid valve: Structurally normal valve. Leaflet separation was normal. Doppler: Transvalvular velocity was within the normal range. No regurgitation.  ------------------------------------------------------------ Right atrium: The atrium was mildly dilated.  ------------------------------------------------------------ Pericardium: The pericardium was normal in appearance.  ------------------------------------------------------------ Post procedure conclusions Ascending Aorta:  - The aorta was normal, not dilated, and non-diseased.  ------------------------------------------------------------  2D measurements Normal Doppler Normal Left ventricle measurements LVID ED, 37.9 mm 43-52 Left ventricle chord, Ea, lat 8.49 cm/ ------- PLAX ann, tiss s LVID ES, 23.7 mm 23-38 DP chord, E/Ea, lat 10.62 ------- PLAX ann, tiss FS, chord, 37 % >29 DP PLAX Ea, med 6.64 cm/ ------- LVPW, ED 14.1 mm ------ ann, tiss s IVS/LVPW 0.79 <1.3 DP ratio, ED E/Ea, med 13.58 ------- Ventricular septum ann, tiss IVS, ED 11.2 mm ------ DP Aorta Mitral valve Root diam, 34 mm ------ Peak E vel 90.2 cm/ ------- ED s Left atrium Peak A vel 62.6 cm/ ------- AP dim 35 mm ------ s AP dim 1.64 cm/m^2 <2.2 Deceleratio 180 ms 150-230 index n time Right ventricle Peak 3 mm  ------- RVID ED, 36.9 mm 19-38 gradient, D Hg PLAX Peak E/A 1.4 ------- ratio Right ventricle Sa vel, lat 13.1 cm/ ------- ann, tiss s DP  Tele: Sinus rhythm with rare short runs of AIVR.  Assessment/Plan:  1. OOH VF arrest.  Bystander CPR, AED defibrillation  2. NSTEMI - occluded LCx and severe RCA stenosis, s/p PCI with DES  3. HTN  4. Hyperlipidemia  5. Hypokalemia  6. Type 2 DM  Pt tired this am. Otherwise I think making good progress. Despite troponin > 20, LV function is normal. BP is soft, but will try to add low-dose ACE and cut back on beta-blocker this am. Otherwise continue current Rx and plan tx tele today. Anticipate home tomorrow am.   Tonny Bollman, M.D. 01/07/2013, 7:29 AM

## 2013-01-12 ENCOUNTER — Encounter (HOSPITAL_COMMUNITY): Payer: Self-pay | Admitting: Physician Assistant

## 2013-01-12 ENCOUNTER — Other Ambulatory Visit: Payer: Self-pay | Admitting: Physician Assistant

## 2013-01-12 DIAGNOSIS — I469 Cardiac arrest, cause unspecified: Secondary | ICD-10-CM

## 2013-01-12 DIAGNOSIS — I2510 Atherosclerotic heart disease of native coronary artery without angina pectoris: Secondary | ICD-10-CM

## 2013-01-12 DIAGNOSIS — N289 Disorder of kidney and ureter, unspecified: Secondary | ICD-10-CM

## 2013-01-12 DIAGNOSIS — E876 Hypokalemia: Secondary | ICD-10-CM

## 2013-01-12 LAB — HEPATIC FUNCTION PANEL
ALT: 42 U/L (ref 0–53)
AST: 41 U/L — ABNORMAL HIGH (ref 0–37)
Albumin: 3.3 g/dL — ABNORMAL LOW (ref 3.5–5.2)
Alkaline Phosphatase: 48 U/L (ref 39–117)
Total Protein: 6.3 g/dL (ref 6.0–8.3)

## 2013-01-12 MED ORDER — TICAGRELOR 90 MG PO TABS: 90.0000 mg | ORAL_TABLET | Freq: Two times a day (BID) | ORAL | Status: DC

## 2013-01-12 MED ORDER — ATORVASTATIN CALCIUM 80 MG PO TABS: 80.0000 mg | ORAL_TABLET | Freq: Every day | ORAL | Status: DC

## 2013-01-12 MED ORDER — NITROGLYCERIN 0.4 MG SL SUBL: 0.4000 mg | SUBLINGUAL_TABLET | SUBLINGUAL | Status: AC | PRN

## 2013-01-12 MED ORDER — POTASSIUM CHLORIDE CRYS ER 20 MEQ PO TBCR
40.0000 meq | EXTENDED_RELEASE_TABLET | Freq: Once | ORAL | Status: AC
  Administered 2013-01-12: 40 meq via ORAL
  Filled 2013-01-12: qty 2

## 2013-01-12 MED ORDER — LISINOPRIL 2.5 MG PO TABS: 2.5000 mg | ORAL_TABLET | Freq: Every day | ORAL | Status: DC

## 2013-01-12 MED ORDER — METOPROLOL TARTRATE 25 MG PO TABS: 12.5000 mg | ORAL_TABLET | Freq: Two times a day (BID) | ORAL | Status: DC

## 2013-01-12 NOTE — Discharge Summary (Signed)
Discharge Summary   Patient ID: Tyler Madden MRN: [id number], DOB/AGE: 64-Feb-1952 64 y.o. Admit date: 01/16/2013 D/C date:     2013/01/30  Primary Cardiologist: Excell Seltzer  Primary Discharge Diagnoses:  1. Out of hospital VF arrest 2. CAD with NSTEMI this admission - LHC: total occ distal LCx s/p DES, severe mid & distal RCA stenosis s/p DES x2 - 2D echo with EF 65-70%, no RWMA this admission 3. HTN 4. HLD 5. Hypokalemia 6. Diabetes mellitus type 2 7. Mild transaminitis, felt likely due to MI 8. Acute renal insufficiency present on admission, improved   Secondary Discharge Diagnoses:  1. Erectile dysfunction  Hospital Course:Tyler Madden is a 62 y/o M with history of HTN, HLD, diet-controlled DM who presented to [facility] 12/31/2012 with out-of-hospital VF arrest. He arrived to the hospital resuscitated, awake, and alert. For the past several weeks he has noted some exertional dyspnea when walking his dog but no chest pain. While playing singles tennis on the day of admission, he was actually feeling good with no dyspnea and had been playing for about 1.5 hours. His opponent noted him to suddenly bend over and then fall to the ground. His eyes had "rolled in the back of his head." He did not have a pulse. CPR was started immediately. EMS arrived and attached an AED. The AED gave him one shock and his pulse came back. He awoke soon after this but did not recall what had happened just before the arrest. He was having mild central chest pain on arrival to the ER. ECG was abnormal but did not meet STEMI criteria. He was given aspirin and started on heparin gtt. Given the nature of the arrest, he was taken to the cath lab for urgent catheterization demonstrating: Final Conclusions:  1. Total occlusion of the distal left circumflex treated successfully with a drug-eluting stent  2. Severe stenoses of the mid and distal right coronary arteries treated successfully with drug-eluting stents  3.  Widely patent left main, LAD, and diagonal branches  He was remarkably stable after his arrest. ASA + Brilinta for at least 12 months was recommended. Atorvastatin was titrated to high dose. Low dose BB was added. He had acute renal insufficiency with presenting Cr of 1.60 that improved to Cr 1.33 at discharge. AST/ALT were marginally elevated on admission felt due to his MI. His low potassium was repleted and HCTZ was discontinued in lieu of resuming home ACEI. Dr. Excell Seltzer does not feel he will require home Kdur at this point. 2D echo showed EF 65-70% without RWMA, mild MR, mild LVH. He ambulated with cardiac rehab and had no further arrhythmia. Dr. Excell Seltzer has seen and examined the patient today and feels he is stable for discharge today. He recommends no driving x 1 week and no heavy exertion until seen back in followup. 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 He received 30 day free card rx for Brilinta + regular rx.  Consider later OP f/u labs given statin titration this admission. Will obtain BMET in approx 1 week with TOC appt given mild Cr increase present on admission (that has improved) and hypokalemia.   Discharge Vitals: Blood pressure 139/75, pulse 70, temperature 98.7 F (37.1 C), temperature source Oral, resp. rate 18, height 5\' 9"  (1.753 m), weight 200 lb 13.4 oz (91.1 kg), SpO2 95.00%.  Labs: Lab Results  Component Value Date   WBC 7.8 01/11/2013  HGB 12.2* 01-25-13   HCT 34.7* 2013/01/25   MCV 83.6 January 25, 2013   PLT 173 2013/01/25    Recent Labs Lab 01/19/2013 2043  Jan 25, 2013 0402  NA 141  < > 140  K 3.0*  < > 3.3*  CL 103  < > 105  CO2 23  < > 26  BUN 25*  < > 15  CREATININE 1.60*  < > 1.33  CALCIUM 9.8  < > 9.1  PROT 6.8  --   --   BILITOT 0.3  --   --   ALKPHOS 56  --   --   ALT 79*  --   --   AST 81*  --   --   GLUCOSE 168*  < > 114*  < > = values in this interval not displayed.  Recent  Labs  01/03/2013 0300 01/13/2013 0805 01/20/2013 1244  TROPONINI 7.80* >20.00* >20.00*   Lab Results  Component Value Date   CHOL 161 01/09/2013   HDL 43 01/21/2013   LDLCALC 96 01/17/2013   TRIG 110 01/11/2013    Diagnostic Studies/Procedures   Dg Chest Portable 1 View 01/16/2013   *RADIOLOGY REPORT*  Clinical Data: Cardiac arrest  PORTABLE CHEST - 1 VIEW  Comparison: None.  Findings: Low lung volumes.  Lungs are grossly clear.  Upper normal heart size.  Normal vascularity.  No pneumothorax.  IMPRESSION: No active cardiopulmonary disease.   Original Report Authenticated By: Jolaine Click, M.D.   2D Echo 01/12/2013 - Left ventricle: The cavity size was normal. Wall thickness was increased in a pattern of mild LVH. Systolic function was vigorous. The estimated ejection fraction was in the range of 65% to 70%. Wall motion was normal; there were no regional wall motion abnormalities. Left ventricular diastolic function parameters were normal. - Mitral valve: Calcified annulus. Mild regurgitation. - Right ventricle: The cavity size was mildly dilated. - Right atrium: The atrium was mildly dilated.  Cardiac catheterization this admission, please see full report and above for summary  Discharge Medications     Medication List    STOP taking these medications       hydrochlorothiazide 25 MG tablet  Commonly known as:  HYDRODIURIL      TAKE these medications       aspirin 81 MG EC tablet  Take 81 mg by mouth daily.     atorvastatin 80 MG tablet  Commonly known as:  LIPITOR  Take 1 tablet (80 mg total) by mouth daily.     lisinopril 2.5 MG tablet  Commonly known as:  PRINIVIL,ZESTRIL  Take 1 tablet (2.5 mg total) by mouth daily.     metoprolol tartrate 25 MG tablet  Commonly known as:  LOPRESSOR  Take 0.5 tablets (12.5 mg total) by mouth 2 (two) times daily.     nitroGLYCERIN 0.4 MG SL tablet  Commonly known as:  NITROSTAT  Place 1 tablet (0.4 mg total) under the tongue every 5  (five) minutes as needed for chest pain (up to 3 doses).     Ticagrelor 90 MG Tabs tablet  Commonly known as:  BRILINTA  Take 1 tablet (90 mg total) by mouth 2 (two) times daily.        Disposition   The patient will be discharged in stable condition to home. Discharge Orders   Future Appointments Provider Department Dept Phone   01/23/2013 12:15 PM [facility] Main Office [place]) [phone]   01/23/2013 12:30 PM Dyann Kief, PA-C [facility]  Main Office [facility]) [phone]   Future Orders Complete By Expires   Amb Referral to Cardiac Rehabilitation  As directed    Diet - low sodium heart healthy  As directed    Increase activity slowly  As directed    Comments:     No driving for 1 week. No lifting over 10 lbs for 2 weeks. No sexual activity for 2 weeks. You may return to work from home on laptop (low exertion level). Keep procedure site clean & dry. If you notice increased pain, swelling, bleeding or pus, call/return!  You may shower, but no soaking baths/hot tubs/pools for 1 week.     Follow-up Information   Follow up with Jacolyn Reedy, PA-C. ([facility] 01/23/13 at 12:15pm - labwork and appointment)    Specialty:  Cardiology   Contact information:   [address] [place], [place] [address] [place] [address] [phone]         Duration of Discharge Encounter: Greater than 30 minutes including physician and PA time.  Signed, Ronie Spies PA-C 12/31/2012, 10:11 AM

## 2013-01-12 NOTE — Progress Notes (Signed)
    Subjective:  Residual chest soreness. No dyspnea or edema.   Objective:  Vital Signs in the last 24 hours: Temp:  [97.6 F (36.4 C)-98.7 F (37.1 C)] 98.7 F (37.1 C) (09/18 0541) Pulse Rate:  [56-67] 62 (09/18 0541) Resp:  [18-27] 18 (09/17 1950) BP: (110-138)/(57-82) 110/57 mmHg (09/18 0541) SpO2:  [95 %-100 %] 95 % (09/18 0541)  Intake/Output from previous day: 09/17 0701 - 09/18 0700 In: 726 [P.O.:720; I.V.:6] Out: -   Physical Exam: Pt is alert and oriented, NAD HEENT: normal Neck: JVP - normal, carotids 2+= without bruits Lungs: CTA bilaterally CV: RRR without murmur or gallop Chest: tender over the sternum Abd: soft, NT, Positive BS, no hepatomegaly Ext: no C/C/E, distal pulses intact and equal Skin: warm/dry no rash   Lab Results:  Recent Labs  01/06/2013 0300 01/02/2013 0402  WBC 10.0 7.8  HGB 12.9* 12.2*  PLT 187 173    Recent Labs  01/24/2013 0300 01/16/2013 0402  NA 137 140  K 3.3* 3.3*  CL 102 105  CO2 23 26  GLUCOSE 125* 114*  BUN 20 15  CREATININE 1.26 1.33    Recent Labs  01/09/2013 0805 01/24/2013 1244  TROPONINI >20.00* >20.00*    Tele: Sinus rhythm, no arrhythmia  Assessment/Plan:  1. OOH VF arrest. Bystander CPR, AED defibrillation  2. NSTEMI - occluded LCx and severe RCA stenosis, s/p PCI with DES  3. HTN  4. Hyperlipidemia  5. Hypokalemia  6. Type 2 DM  The patient is clinically stable. I have reviewed his medications and they are appropriate. He is ready for discharge this morning. I advised no driving x1 week. He should be seen in followup in one to 2 weeks. We had extensive discussion about post MI medications, restrictions, and lifestyle modification. He is motivated and has a good understanding of all of this.  Tonny Bollman, M.D. 01/12/2013, 7:58 AM

## 2013-01-23 ENCOUNTER — Ambulatory Visit (INDEPENDENT_AMBULATORY_CARE_PROVIDER_SITE_OTHER): Payer: BC Managed Care – PPO | Admitting: Physician Assistant

## 2013-01-23 ENCOUNTER — Encounter: Payer: Self-pay | Admitting: Physician Assistant

## 2013-01-23 ENCOUNTER — Other Ambulatory Visit: Payer: Self-pay | Admitting: *Deleted

## 2013-01-23 ENCOUNTER — Other Ambulatory Visit: Payer: BC Managed Care – PPO

## 2013-01-23 VITALS — BP 124/82 | HR 55 | Ht 69.0 in | Wt 196.0 lb

## 2013-01-23 DIAGNOSIS — I2510 Atherosclerotic heart disease of native coronary artery without angina pectoris: Secondary | ICD-10-CM

## 2013-01-23 DIAGNOSIS — I10 Essential (primary) hypertension: Secondary | ICD-10-CM

## 2013-01-23 DIAGNOSIS — N289 Disorder of kidney and ureter, unspecified: Secondary | ICD-10-CM

## 2013-01-23 DIAGNOSIS — E785 Hyperlipidemia, unspecified: Secondary | ICD-10-CM

## 2013-01-23 DIAGNOSIS — E876 Hypokalemia: Secondary | ICD-10-CM

## 2013-01-23 LAB — BASIC METABOLIC PANEL
CO2: 27 mEq/L (ref 19–32)
Calcium: 9.3 mg/dL (ref 8.4–10.5)
Creatinine, Ser: 1.4 mg/dL (ref 0.4–1.5)
Glucose, Bld: 89 mg/dL (ref 70–99)

## 2013-01-23 NOTE — Progress Notes (Signed)
HPI:   This is a 62 year old male patient Tyler Madden who had out of hospital V. fib arrest treated with CPR and defibrillation on the tennis court. He suffered a non-ST elevation MI and was treated with drug-eluting stent to a total circumflex as well as drug-eluting stents to the mid and distal RCA. Followup 2-D echo shows ejection fraction of 65-70% with no wall motion abnormality. He also has history of hypertension, hyperlipidemia, diabetes mellitus type 2, and acute renal insufficiency which improved at discharge. His HCTZ was stopped. He had mild transaminase elevation felt due to MI.  The patient is here 1 week after his MI. He is back to work and denies any problems. He denies chest pain, palpitations, dyspnea, dyspnea on exertion, dizziness, or presyncope. He continues to have soreness in his chest from CPR and defibrillation. He is scheduled to have his bmet checked today and start cardiac rehabilitation Thursday. He is walking 15 minutes twice a day.  Allergies:  -- Codeine    --  REACTION: nausea  -- Tadalafil    --  REACTION: sob  Current Outpatient Prescriptions on File Prior to Visit: aspirin 81 MG EC tablet, Take 81 mg by mouth daily.  , Disp: , Rfl:  atorvastatin (LIPITOR) 80 MG tablet, Take 1 tablet (80 mg total) by mouth daily., Disp: 30 tablet, Rfl: 6 lisinopril (PRINIVIL,ZESTRIL) 2.5 MG tablet, Take 1 tablet (2.5 mg total) by mouth daily., Disp: 30 tablet, Rfl: 6 metoprolol tartrate (LOPRESSOR) 25 MG tablet, Take 0.5 tablets (12.5 mg total) by mouth 2 (two) times daily., Disp: 60 tablet, Rfl: 6 nitroGLYCERIN (NITROSTAT) 0.4 MG SL tablet, Place 1 tablet (0.4 mg total) under the tongue every 5 (five) minutes as needed for chest pain (up to 3 doses)., Disp: 25 tablet, Rfl: 4 Ticagrelor (BRILINTA) 90 MG TABS tablet, Take 1 tablet (90 mg total) by mouth 2 (two) times daily., Disp: 60 tablet, Rfl: 10  No current facility-administered medications on file prior to visit.   Past  Medical History:   Hyperlipidemia                                               Hypertension                                                 ED (erectile dysfunction)                                    Diabetes mellitus, type II                                     Comment: 2011---> A1C 6.2     CAD (coronary artery disease)                                  Comment:a. OOH VF arrest/NSTEMI 12/2012 - total occ               distal LCx s/p DES, severe mid & distal RCA  stenosis s/p DES x2. 2D Echo with EF 65-70%.   Cardiac arrest                                                 Comment:a. OOH VF arrest 12/2012 in setting of NSTEMI,               successfully resuscitated to alertness before               arrival to hospital.   Hypokalemia                                                  Acute renal insufficiency                                      Comment:a. Cr 1.6 on presentation to hospital after OOH              arrest, improved.  Past Surgical History:   VASECTOMY                                                     HYDROCELE EXCISION                                           Review of patient's family history indicates:   Diabetes                       Mother                   Hypertension                   Mother                   Coronary artery disease        Mother                     Comment: CABG   Diabetes                       Father                   Alcohol abuse                  Father                     Comment: CIRRHOSIS   Stroke                         Neg Hx                   Cancer  Neg Hx                   Social History   Marital Status: Married             Spouse Name:                      Years of Education:                 Number of children:             Occupational History   None on file  Social History Main Topics   Smoking Status: Former Smoker                   Packs/Day: 0.00  Years:           Types: Cigarettes    Smokeless Status: Never Used                       Alcohol Use: Not on file    Drug Use: Not on file    Sexual Activity: Not on file        Other Topics            Concern   None on file  Social History Narrative   None on file    ROS: See history of present illness otherwise negative   PHYSICAL EXAM: Well-nournished, in no acute distress. Neck: No JVD, HJR, Bruit, or thyroid enlargement  Lungs: No tachypnea, clear without wheezing, rales, or rhonchi  Cardiovascular: RRR, PMI not displaced, heart sounds normal, no murmurs, gallops, bruit, thrill, or heave.  Abdomen: BS normal. Soft without organomegaly, masses, lesions or tenderness.  Extremities: Right arm at cath site without hematoma or hemorrhage good brachial and radial pulses, lower extremities without cyanosis, clubbing or edema. Good distal pulses bilateral  SKin: Warm, no lesions or rashes   Musculoskeletal: No deformities  Neuro: no focal signs  BP 124/82  Pulse 55  Ht 5\' 9"  (1.753 m)  Wt 196 lb (88.905 kg)  BMI 28.93 kg/m2   EKG: Sinus bradycardia 55 beats per minute T wave inversion inferiorly, no acute change

## 2013-01-23 NOTE — Assessment & Plan Note (Signed)
Recheck bmet today 

## 2013-01-23 NOTE — Patient Instructions (Signed)
Your physician recommends that you have lab work today: bmp  Your physician recommends that you return for FASTING lab work in: 1 month- lipid/liver  Your physician recommends that you schedule a follow-up appointment in: 2 months with Dr. Excell Seltzer  No heavy lifting or strenuous activity for 4 weeks.

## 2013-01-23 NOTE — Assessment & Plan Note (Signed)
Stable

## 2013-01-23 NOTE — Assessment & Plan Note (Signed)
Patient is doing extremely well status post out of hospital cardiac arrest and MI treated with drug-eluting stents of circumflex, and mid and distal RCA. Ejection fraction 65-70% on 2-D echo with no wall motion abnormalities. He is without chest pain. He was to increase his activities. I told him to start cardiac rehabilitation this Thursday and gradually increase his activities. No heavy lifting or strenuous activity for 4 weeks.

## 2013-01-23 NOTE — Addendum Note (Signed)
Addended by: Williemae Area on: 01/23/2013 01:30 PM   Modules accepted: Orders

## 2013-01-23 NOTE — Assessment & Plan Note (Signed)
Fasting lipid panel and LFTs in 4 weeks 

## 2013-01-23 NOTE — Addendum Note (Signed)
Addended by: Williemae Area on: 01/23/2013 01:29 PM   Modules accepted: Orders

## 2013-01-25 DIAGNOSIS — 419620001 Death: Secondary | SNOMED CT

## 2013-01-25 DEATH — deceased

## 2013-02-02 ENCOUNTER — Encounter (HOSPITAL_COMMUNITY)
Admission: RE | Admit: 2013-02-02 | Discharge: 2013-02-02 | Disposition: A | Discharge status: Home / Self Care | Payer: BC Managed Care – PPO | Source: Ambulatory Visit | Attending: Cardiology | Admitting: Cardiology

## 2013-02-02 DIAGNOSIS — I214 Non-ST elevation (NSTEMI) myocardial infarction: Secondary | ICD-10-CM | POA: Insufficient documentation

## 2013-02-02 DIAGNOSIS — Z9861 Coronary angioplasty status: Secondary | ICD-10-CM | POA: Insufficient documentation

## 2013-02-02 DIAGNOSIS — Z5189 Encounter for other specified aftercare: Secondary | ICD-10-CM | POA: Insufficient documentation

## 2013-02-02 NOTE — Progress Notes (Signed)
Cardiac Rehab Medication Review by a Pharmacist  Does the patient  feel that his/her medications are working for him/her?  yes  Has the patient been experiencing any side effects to the medications prescribed?  no  Does the patient measure his/her own blood pressure or blood glucose at home?  yes   Does the patient have any problems obtaining medications due to transportation or finances?   no  Understanding of regimen: good Understanding of indications: good Potential of compliance: good    Pharmacist comments: Pleasant 62 yo WM with good understanding of regimen. Did complain of some mild chest pain for which I suggested some GERD options if related to this. I also suggested contacting MD if did not resolve in the next few days. Otherwise doing well with no other complaints and good compliance.     Von Beacher May 02/02/2013 8:49 AM

## 2013-02-06 ENCOUNTER — Encounter (HOSPITAL_COMMUNITY)
Admission: RE | Admit: 2013-02-06 | Discharge: 2013-02-06 | Disposition: A | Discharge status: Home / Self Care | Payer: BC Managed Care – PPO | Source: Ambulatory Visit | Attending: Cardiology | Admitting: Cardiology

## 2013-02-06 LAB — GLUCOSE, CAPILLARY
Glucose-Capillary: 131 mg/dL — ABNORMAL HIGH (ref 70–99)
Glucose-Capillary: 92 mg/dL (ref 70–99)

## 2013-02-08 ENCOUNTER — Encounter (HOSPITAL_COMMUNITY)
Admission: RE | Admit: 2013-02-08 | Discharge: 2013-02-08 | Disposition: A | Discharge status: Home / Self Care | Payer: BC Managed Care – PPO | Source: Ambulatory Visit | Attending: Cardiology | Admitting: Cardiology

## 2013-02-08 NOTE — Progress Notes (Signed)
PSYCHOSOCIAL ASSESSMENT  Pt psychosocial assessment reveals no barriers to rehab participation.  Pt quality of life is slightly altered by chest pain which is tender to touch. MD has told pt this is r/t CPR.  Pt reports these symptoms are actually diminishing.  Pt reports death of 2 sisters over past 2 years.  Pt reports this has been difficult for him, especially his youngest sister.  Pt exhibits normal grief pattern.  Overall quality of life scores good.  Pt exhibits positive coping skills and has supportive family.  Offered emotional support and reassurance.  Will continue to monitor.

## 2013-02-08 NOTE — Progress Notes (Signed)
Reviewed home exercise with pt today.  Pt plans to walk at home for 30 minutes, 2-4 days/week outside of CRP II, with an additional day of hand weights for exercise.  Reviewed THR, pulse, RPE, sign and symptoms, NTG use, and when to call 911 or MD.  Pt voiced understanding. [id number]  Alexia Freestone, MS, ACSM RCEP 10:57 AM 02/08/2013

## 2013-02-08 NOTE — Progress Notes (Signed)
(  late entry for 02/06/13)  Pt started cardiac rehab today.  Pt tolerated light exercise without difficulty.  VSS, telemetry-NSR, rare PVC.  Lowest HR-54.  PHQ-0.  Asymptomatic.  Pt oriented to exercise equipment and routine. Pt did not complete entire homework packet. Pt given instructions to complete undone portions and return to rehab. Understanding verbalized.

## 2013-02-10 ENCOUNTER — Encounter (HOSPITAL_COMMUNITY)
Admission: RE | Admit: 2013-02-10 | Discharge: 2013-02-10 | Disposition: A | Discharge status: Home / Self Care | Payer: BC Managed Care – PPO | Source: Ambulatory Visit | Attending: Cardiology | Admitting: Cardiology

## 2013-02-13 ENCOUNTER — Encounter (HOSPITAL_COMMUNITY): Payer: BC Managed Care – PPO

## 2013-02-15 ENCOUNTER — Encounter (HOSPITAL_COMMUNITY): Payer: BC Managed Care – PPO

## 2013-02-17 ENCOUNTER — Encounter (HOSPITAL_COMMUNITY): Payer: BC Managed Care – PPO

## 2013-02-20 ENCOUNTER — Telehealth: Payer: Self-pay

## 2013-02-20 ENCOUNTER — Encounter (HOSPITAL_COMMUNITY)
Admission: RE | Admit: 2013-02-20 | Discharge: 2013-02-20 | Disposition: A | Discharge status: Home / Self Care | Payer: BC Managed Care – PPO | Source: Ambulatory Visit | Attending: Cardiology | Admitting: Cardiology

## 2013-02-20 NOTE — Telephone Encounter (Signed)
Updated CCS in HM to reflect reports

## 2013-02-22 ENCOUNTER — Encounter (HOSPITAL_COMMUNITY): Payer: BC Managed Care – PPO

## 2013-02-22 ENCOUNTER — Ambulatory Visit (INDEPENDENT_AMBULATORY_CARE_PROVIDER_SITE_OTHER): Payer: BC Managed Care – PPO | Admitting: *Deleted

## 2013-02-22 DIAGNOSIS — E785 Hyperlipidemia, unspecified: Secondary | ICD-10-CM

## 2013-02-22 DIAGNOSIS — E119 Type 2 diabetes mellitus without complications: Secondary | ICD-10-CM

## 2013-02-22 DIAGNOSIS — I10 Essential (primary) hypertension: Secondary | ICD-10-CM

## 2013-02-22 DIAGNOSIS — I2510 Atherosclerotic heart disease of native coronary artery without angina pectoris: Secondary | ICD-10-CM

## 2013-02-22 DIAGNOSIS — I214 Non-ST elevation (NSTEMI) myocardial infarction: Secondary | ICD-10-CM

## 2013-02-22 LAB — HEPATIC FUNCTION PANEL
ALT: 35 U/L (ref 0–53)
Albumin: 4 g/dL (ref 3.5–5.2)
Alkaline Phosphatase: 80 U/L (ref 39–117)
Bilirubin, Direct: 0.1 mg/dL (ref 0.0–0.3)
Total Protein: 6.6 g/dL (ref 6.0–8.3)

## 2013-02-22 LAB — BASIC METABOLIC PANEL
BUN: 21 mg/dL (ref 6–23)
Chloride: 106 mEq/L (ref 96–112)
Creatinine, Ser: 1.3 mg/dL (ref 0.4–1.5)
GFR: 59.05 mL/min — ABNORMAL LOW (ref >60.00)
Potassium: 3.9 mEq/L (ref 3.5–5.1)
Sodium: 140 mEq/L (ref 135–145)

## 2013-02-22 LAB — LIPID PANEL
Cholesterol: 124 mg/dL (ref 0–200)
Triglycerides: 132 mg/dL (ref 0.0–149.0)
VLDL: 26.4 mg/dL (ref 0.0–40.0)

## 2013-02-24 ENCOUNTER — Encounter (HOSPITAL_COMMUNITY)
Admission: RE | Admit: 2013-02-24 | Discharge: 2013-02-24 | Disposition: A | Discharge status: Home / Self Care | Payer: BC Managed Care – PPO | Source: Ambulatory Visit | Attending: Cardiology | Admitting: Cardiology

## 2013-02-27 ENCOUNTER — Encounter (HOSPITAL_COMMUNITY)
Admission: RE | Admit: 2013-02-27 | Discharge: 2013-02-27 | Disposition: A | Discharge status: Home / Self Care | Payer: BC Managed Care – PPO | Source: Ambulatory Visit | Attending: Cardiology | Admitting: Cardiology

## 2013-02-27 DIAGNOSIS — Z9861 Coronary angioplasty status: Secondary | ICD-10-CM | POA: Insufficient documentation

## 2013-02-27 DIAGNOSIS — Z5189 Encounter for other specified aftercare: Secondary | ICD-10-CM | POA: Insufficient documentation

## 2013-02-27 DIAGNOSIS — I214 Non-ST elevation (NSTEMI) myocardial infarction: Secondary | ICD-10-CM | POA: Insufficient documentation

## 2013-03-01 ENCOUNTER — Encounter (HOSPITAL_COMMUNITY)
Admission: RE | Admit: 2013-03-01 | Discharge: 2013-03-01 | Disposition: A | Discharge status: Home / Self Care | Payer: BC Managed Care – PPO | Source: Ambulatory Visit | Attending: Cardiology | Admitting: Cardiology

## 2013-03-02 ENCOUNTER — Other Ambulatory Visit: Payer: Self-pay

## 2013-03-03 ENCOUNTER — Encounter (HOSPITAL_COMMUNITY)
Admission: RE | Admit: 2013-03-03 | Discharge: 2013-03-03 | Disposition: A | Discharge status: Home / Self Care | Payer: BC Managed Care – PPO | Source: Ambulatory Visit | Attending: Cardiology | Admitting: Cardiology

## 2013-03-06 ENCOUNTER — Encounter (HOSPITAL_COMMUNITY)
Admission: RE | Admit: 2013-03-06 | Discharge: 2013-03-06 | Disposition: A | Discharge status: Home / Self Care | Payer: BC Managed Care – PPO | Source: Ambulatory Visit | Attending: Cardiology | Admitting: Cardiology

## 2013-03-08 ENCOUNTER — Encounter (HOSPITAL_COMMUNITY)
Admission: RE | Admit: 2013-03-08 | Discharge: 2013-03-08 | Disposition: A | Discharge status: Home / Self Care | Payer: BC Managed Care – PPO | Source: Ambulatory Visit | Attending: Cardiology | Admitting: Cardiology

## 2013-03-08 ENCOUNTER — Encounter (HOSPITAL_COMMUNITY): Payer: BC Managed Care – PPO

## 2013-03-10 ENCOUNTER — Encounter (HOSPITAL_COMMUNITY)
Admission: RE | Admit: 2013-03-10 | Discharge: 2013-03-10 | Disposition: A | Discharge status: Home / Self Care | Payer: BC Managed Care – PPO | Source: Ambulatory Visit | Attending: Cardiology | Admitting: Cardiology

## 2013-03-13 ENCOUNTER — Encounter (HOSPITAL_COMMUNITY)
Admission: RE | Admit: 2013-03-13 | Discharge: 2013-03-13 | Disposition: A | Discharge status: Home / Self Care | Payer: BC Managed Care – PPO | Source: Ambulatory Visit | Attending: Cardiology | Admitting: Cardiology

## 2013-03-13 NOTE — Progress Notes (Signed)
Tyler Madden 62 y.o. male Nutrition Note Spoke with pt.  Nutrition Plan and Nutrition Survey goals reviewed with pt. Pt is following Step 2 of the Therapeutic Lifestyle Changes diet. Pt wants to lose wt. Pt has been trying to lose wt by exercising 30 minutes on his non-rehab days and "changing my diet." Wt loss tips reviewed.  Pt is diabetic according to EMR. Pt denies ever being told he is DM. Pt states he was told he was pre-diabetic. Pt reports he was told his A1c was "over 6." Last A1c indicates blood glucose well-controlled. This Clinical research associate went over Diabetes Education test results. Pt expressed understanding of the information reviewed. Pt aware of nutrition education classes offered and plans on attending nutrition classes. Nutrition Diagnosis   Food-and nutrition-related knowledge deficit related to lack of exposure to information as related to diagnosis of: ? CVD ? DM (A1c 5.5)   Overweight related to excessive energy intake as evidenced by a BMI of 28.9  Nutrition RX/ Estimated Daily Nutrition Needs for: wt loss  1550-2050 Kcal, 40-55 gm fat, 10-14 gm sat fat, 1.5-2.0 gm trans-fat, <1500 mg sodium, 175-250 gm CHO   Nutrition Intervention   Pt's individual nutrition plan reviewed with pt.   Benefits of adopting Therapeutic Lifestyle Changes discussed when Medficts reviewed.   Pt to attend the Portion Distortion class   Pt to attend the  ? Nutrition I class                     ? Nutrition II class   Pt given handouts for: ? Tuesday Nutrition class schedule   Continue client-centered nutrition education by RD, as part of interdisciplinary care. Goal(s)   Pt to identify food quantities necessary to achieve: ? wt loss to a goal wt of 174-192 lb (79.3-87.5 kg) at graduation from cardiac rehab.  Monitor and Evaluate progress toward nutrition goal with team. Nutrition Risk: Change to Moderate [name], M.Ed, RD, LDN, CDE 03/13/2013 8:50 AM

## 2013-03-15 ENCOUNTER — Encounter: Payer: Self-pay | Admitting: Cardiovascular Disease

## 2013-03-15 ENCOUNTER — Ambulatory Visit (INDEPENDENT_AMBULATORY_CARE_PROVIDER_SITE_OTHER): Payer: BC Managed Care – PPO | Admitting: Cardiovascular Disease

## 2013-03-15 ENCOUNTER — Encounter (HOSPITAL_COMMUNITY)
Admission: RE | Admit: 2013-03-15 | Discharge: 2013-03-15 | Disposition: A | Discharge status: Home / Self Care | Payer: BC Managed Care – PPO | Source: Ambulatory Visit | Attending: Cardiology | Admitting: Cardiology

## 2013-03-15 VITALS — BP 124/66 | HR 73 | Ht 69.0 in | Wt 195.0 lb

## 2013-03-15 DIAGNOSIS — I2510 Atherosclerotic heart disease of native coronary artery without angina pectoris: Secondary | ICD-10-CM

## 2013-03-15 NOTE — Patient Instructions (Signed)
Your physician wants you to follow-up in: 6 MONTHS with Dr Cooper.  You will receive a reminder letter in the mail two months in advance. If you don't receive a letter, please call our office to schedule the follow-up appointment.  Your physician recommends that you continue on your current medications as directed. Please refer to the Current Medication list given to you today.  

## 2013-03-15 NOTE — Progress Notes (Signed)
    HPI:  62 year old gentleman presenting for followup evaluation. The patient has coronary artery disease and sustained an out of hospital ventricular fibrillation arrest in September 2014. He was playing tennis at the time and underwent CPR and defibrillation. He underwent urgent cardiac catheterization demonstrating severe two-vessel coronary artery disease and he was treated with drug-eluting stents in the left circumflex and right coronary arteries. His left ventricular function was normal with an ejection fraction of 65-70%.  The patient is doing well. He has no complaints today. He specifically denies chest pain, chest pressure, shortness of breath, edema, or palpitations. He is physically active without exertional symptoms. He is tolerating his medical therapy without problems.  Outpatient Encounter Prescriptions as of 03/15/2013  Medication Sig  . aspirin 81 MG EC tablet Take 81 mg by mouth daily.    Marland Kitchen atorvastatin (LIPITOR) 80 MG tablet Take 1 tablet (80 mg total) by mouth daily.  Marland Kitchen lisinopril (PRINIVIL,ZESTRIL) 2.5 MG tablet Take 1 tablet (2.5 mg total) by mouth daily.  . metoprolol tartrate (LOPRESSOR) 25 MG tablet Take 0.5 tablets (12.5 mg total) by mouth 2 (two) times daily.  . nitroGLYCERIN (NITROSTAT) 0.4 MG SL tablet Place 1 tablet (0.4 mg total) under the tongue every 5 (five) minutes as needed for chest pain (up to 3 doses).  . Ticagrelor (BRILINTA) 90 MG TABS tablet Take 1 tablet (90 mg total) by mouth 2 (two) times daily.    Allergies  Allergen Reactions  . Codeine     REACTION: nausea  . Tadalafil     REACTION: sob    Past Medical History  Diagnosis Date  . Hyperlipidemia   . Hypertension   . ED (erectile dysfunction)   . Diabetes mellitus, type II      2011---> A1C 6.2    . CAD (coronary artery disease)     a. OOH VF arrest/NSTEMI 12/2012 - total occ distal LCx s/p DES, severe mid & distal RCA stenosis s/p DES x2. 2D Echo with EF 65-70%.  . Cardiac arrest    a. OOH VF arrest 12/2012 in setting of NSTEMI, successfully resuscitated to alertness before arrival to hospital.  . Hypokalemia   . Acute renal insufficiency     a. Cr 1.6 on presentation to hospital after OOH arrest, improved.    ROS: Negative except as per HPI  BP 124/66  Pulse 73  Ht 5\' 9"  (1.753 m)  Wt 195 lb (88.451 kg)  BMI 28.78 kg/m2  SpO2 98%  PHYSICAL EXAM: Pt is alert and oriented, NAD HEENT: normal Neck: JVP - normal, carotids 2+= without bruits Lungs: CTA bilaterally CV: RRR without murmur or gallop Abd: soft, NT, Positive BS, no hepatomegaly Ext: no C/C/E, distal pulses intact and equal Skin: warm/dry no rash  ASSESSMENT AND PLAN: 1. Coronary atherosclerosis, native vessel. He has stable without symptoms of angina. He will continue on dual antiplatelet therapy with aspirin and brilinta. He continues to participate in cardiac rehabilitation and I reviewed his flow sheets. He is doing well.  2. Essential hypertension. Blood pressure is well controlled on lisinopril and metoprolol.  3. Hyperlipidemia. The patient is on a high intensity statin drug. Lipids from 02/22/2013 showed cholesterol 124, triglycerides 132, HDL 39, and LDL 59.  Tonny Bollman 03/17/2013 12:42 AM

## 2013-03-16 DIAGNOSIS — Z23 Encounter for immunization: Secondary | ICD-10-CM

## 2013-03-17 ENCOUNTER — Encounter: Payer: Self-pay | Admitting: Cardiovascular Disease

## 2013-03-17 ENCOUNTER — Encounter (HOSPITAL_COMMUNITY)
Admission: RE | Admit: 2013-03-17 | Discharge: 2013-03-17 | Disposition: A | Discharge status: Home / Self Care | Payer: BC Managed Care – PPO | Source: Ambulatory Visit | Attending: Cardiology | Admitting: Cardiology

## 2013-03-20 ENCOUNTER — Encounter (HOSPITAL_COMMUNITY)
Admission: RE | Admit: 2013-03-20 | Discharge: 2013-03-20 | Disposition: A | Discharge status: Home / Self Care | Payer: BC Managed Care – PPO | Source: Ambulatory Visit | Attending: Cardiology | Admitting: Cardiology

## 2013-03-22 ENCOUNTER — Encounter (HOSPITAL_COMMUNITY)
Admission: RE | Admit: 2013-03-22 | Discharge: 2013-03-22 | Disposition: A | Discharge status: Home / Self Care | Payer: BC Managed Care – PPO | Source: Ambulatory Visit | Attending: Cardiology | Admitting: Cardiology

## 2013-03-24 ENCOUNTER — Encounter (HOSPITAL_COMMUNITY): Payer: BC Managed Care – PPO

## 2013-03-27 ENCOUNTER — Encounter (HOSPITAL_COMMUNITY)
Admission: RE | Admit: 2013-03-27 | Discharge: 2013-03-27 | Disposition: A | Discharge status: Home / Self Care | Payer: BC Managed Care – PPO | Source: Ambulatory Visit | Attending: Cardiology | Admitting: Cardiology

## 2013-03-27 DIAGNOSIS — Z9861 Coronary angioplasty status: Secondary | ICD-10-CM | POA: Insufficient documentation

## 2013-03-27 DIAGNOSIS — I214 Non-ST elevation (NSTEMI) myocardial infarction: Secondary | ICD-10-CM | POA: Insufficient documentation

## 2013-03-27 DIAGNOSIS — Z5189 Encounter for other specified aftercare: Secondary | ICD-10-CM | POA: Insufficient documentation

## 2013-03-29 ENCOUNTER — Encounter (HOSPITAL_COMMUNITY)
Admission: RE | Admit: 2013-03-29 | Discharge: 2013-03-29 | Disposition: A | Discharge status: Home / Self Care | Payer: BC Managed Care – PPO | Source: Ambulatory Visit | Attending: Cardiology | Admitting: Cardiology

## 2013-03-31 ENCOUNTER — Encounter (HOSPITAL_COMMUNITY)
Admission: RE | Admit: 2013-03-31 | Discharge: 2013-03-31 | Disposition: A | Discharge status: Home / Self Care | Payer: BC Managed Care – PPO | Source: Ambulatory Visit | Attending: Cardiology | Admitting: Cardiology

## 2013-04-03 ENCOUNTER — Encounter (HOSPITAL_COMMUNITY)
Admission: RE | Admit: 2013-04-03 | Discharge: 2013-04-03 | Disposition: A | Discharge status: Home / Self Care | Payer: BC Managed Care – PPO | Source: Ambulatory Visit | Attending: Cardiology | Admitting: Cardiology

## 2013-04-05 ENCOUNTER — Encounter (HOSPITAL_COMMUNITY)
Admission: RE | Admit: 2013-04-05 | Discharge: 2013-04-05 | Disposition: A | Discharge status: Home / Self Care | Payer: BC Managed Care – PPO | Source: Ambulatory Visit | Attending: Cardiology | Admitting: Cardiology

## 2013-04-07 ENCOUNTER — Encounter (HOSPITAL_COMMUNITY)
Admission: RE | Admit: 2013-04-07 | Discharge: 2013-04-07 | Disposition: A | Discharge status: Home / Self Care | Payer: BC Managed Care – PPO | Source: Ambulatory Visit | Attending: Cardiology | Admitting: Cardiology

## 2013-04-10 ENCOUNTER — Encounter (HOSPITAL_COMMUNITY)
Admission: RE | Admit: 2013-04-10 | Discharge: 2013-04-10 | Disposition: A | Discharge status: Home / Self Care | Payer: BC Managed Care – PPO | Source: Ambulatory Visit | Attending: Cardiology | Admitting: Cardiology

## 2013-04-12 ENCOUNTER — Encounter (HOSPITAL_COMMUNITY)
Admission: RE | Admit: 2013-04-12 | Discharge: 2013-04-12 | Disposition: A | Discharge status: Home / Self Care | Payer: BC Managed Care – PPO | Source: Ambulatory Visit | Attending: Cardiology | Admitting: Cardiology

## 2013-04-14 ENCOUNTER — Encounter (HOSPITAL_COMMUNITY)
Admission: RE | Admit: 2013-04-14 | Discharge: 2013-04-14 | Disposition: A | Discharge status: Home / Self Care | Payer: BC Managed Care – PPO | Source: Ambulatory Visit | Attending: Cardiology | Admitting: Cardiology

## 2013-04-14 NOTE — Progress Notes (Signed)
Pt BP 140/92 upon arrival to cardaic rehab today. Pt reports he took his AM medications 1 hour later this am.  Pt also c/o nasal congestion x2 days which is resolving, pt afebrile, no cough or nasal congestion x 24 hours.  .  Pt reports he feels well today and would like to exercise.  Pt encouraged to reduce workloads and drink H20 with exercise.  Understanding verbalized

## 2013-04-17 ENCOUNTER — Encounter (HOSPITAL_COMMUNITY)
Admission: RE | Admit: 2013-04-17 | Discharge: 2013-04-17 | Disposition: A | Discharge status: Home / Self Care | Payer: BC Managed Care – PPO | Source: Ambulatory Visit | Attending: Cardiology | Admitting: Cardiology

## 2013-04-19 ENCOUNTER — Encounter (HOSPITAL_COMMUNITY)
Admission: RE | Admit: 2013-04-19 | Discharge: 2013-04-19 | Disposition: A | Discharge status: Home / Self Care | Payer: BC Managed Care – PPO | Source: Ambulatory Visit | Attending: Cardiology | Admitting: Cardiology

## 2013-04-21 ENCOUNTER — Encounter (HOSPITAL_COMMUNITY): Admission: RE | Admit: 2013-04-21 | Payer: BC Managed Care – PPO | Source: Ambulatory Visit

## 2013-04-24 ENCOUNTER — Encounter (HOSPITAL_COMMUNITY)
Admission: RE | Admit: 2013-04-24 | Discharge: 2013-04-24 | Disposition: A | Discharge status: Home / Self Care | Payer: BC Managed Care – PPO | Source: Ambulatory Visit | Attending: Cardiology | Admitting: Cardiology

## 2013-04-26 ENCOUNTER — Encounter (HOSPITAL_COMMUNITY)
Admission: RE | Admit: 2013-04-26 | Discharge: 2013-04-26 | Disposition: A | Discharge status: Home / Self Care | Payer: BC Managed Care – PPO | Source: Ambulatory Visit | Attending: Cardiology | Admitting: Cardiology

## 2013-04-28 ENCOUNTER — Encounter (HOSPITAL_COMMUNITY)
Admission: RE | Admit: 2013-04-28 | Discharge: 2013-04-28 | Disposition: A | Discharge status: Home / Self Care | Payer: BC Managed Care – PPO | Source: Ambulatory Visit | Attending: Cardiology | Admitting: Cardiology

## 2013-04-28 DIAGNOSIS — Z9861 Coronary angioplasty status: Secondary | ICD-10-CM | POA: Insufficient documentation

## 2013-04-28 DIAGNOSIS — Z5189 Encounter for other specified aftercare: Secondary | ICD-10-CM | POA: Insufficient documentation

## 2013-04-28 DIAGNOSIS — I214 Non-ST elevation (NSTEMI) myocardial infarction: Secondary | ICD-10-CM | POA: Insufficient documentation

## 2013-05-01 ENCOUNTER — Encounter (HOSPITAL_COMMUNITY)
Admission: RE | Admit: 2013-05-01 | Discharge: 2013-05-01 | Disposition: A | Discharge status: Home / Self Care | Payer: BC Managed Care – PPO | Source: Ambulatory Visit | Attending: Cardiology | Admitting: Cardiology

## 2013-05-03 ENCOUNTER — Encounter (HOSPITAL_COMMUNITY)
Admission: RE | Admit: 2013-05-03 | Discharge: 2013-05-03 | Disposition: A | Discharge status: Home / Self Care | Payer: BC Managed Care – PPO | Source: Ambulatory Visit | Attending: Cardiology | Admitting: Cardiology

## 2013-05-05 ENCOUNTER — Encounter (HOSPITAL_COMMUNITY)
Admission: RE | Admit: 2013-05-05 | Discharge: 2013-05-05 | Disposition: A | Discharge status: Home / Self Care | Payer: BC Managed Care – PPO | Source: Ambulatory Visit | Attending: Cardiology | Admitting: Cardiology

## 2013-05-08 ENCOUNTER — Telehealth: Payer: Self-pay

## 2013-05-08 ENCOUNTER — Encounter (HOSPITAL_COMMUNITY)
Admission: RE | Admit: 2013-05-08 | Discharge: 2013-05-08 | Disposition: A | Discharge status: Home / Self Care | Payer: BC Managed Care – PPO | Source: Ambulatory Visit | Attending: Cardiology | Admitting: Cardiology

## 2013-05-08 NOTE — Telephone Encounter (Signed)
  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

## 2013-05-09 ENCOUNTER — Encounter: Payer: Self-pay | Admitting: Lab

## 2013-05-09 ENCOUNTER — Ambulatory Visit (INDEPENDENT_AMBULATORY_CARE_PROVIDER_SITE_OTHER): Payer: BC Managed Care – PPO | Admitting: Internal Medicine

## 2013-05-09 ENCOUNTER — Encounter: Payer: Self-pay | Admitting: Internal Medicine

## 2013-05-09 VITALS — BP 142/90 | HR 60 | Temp 98.2°F | Ht 70.1 in | Wt 191.0 lb

## 2013-05-09 DIAGNOSIS — I2510 Atherosclerotic heart disease of native coronary artery without angina pectoris: Secondary | ICD-10-CM

## 2013-05-09 DIAGNOSIS — Z0000 Encounter for general adult medical examination without abnormal findings: Secondary | ICD-10-CM

## 2013-05-09 DIAGNOSIS — E119 Type 2 diabetes mellitus without complications: Secondary | ICD-10-CM

## 2013-05-09 DIAGNOSIS — E785 Hyperlipidemia, unspecified: Secondary | ICD-10-CM

## 2013-05-09 DIAGNOSIS — I10 Essential (primary) hypertension: Secondary | ICD-10-CM

## 2013-05-09 DIAGNOSIS — Z23 Encounter for immunization: Secondary | ICD-10-CM

## 2013-05-09 LAB — CBC WITH DIFFERENTIAL/PLATELET
BASOS PCT: 0.7 % (ref 0.0–3.0)
Basophils Absolute: 0 10*3/uL (ref 0.0–0.1)
EOS PCT: 1.1 % (ref 0.0–5.0)
Eosinophils Absolute: 0.1 10*3/uL (ref 0.0–0.7)
HEMATOCRIT: 43.3 % (ref 39.0–52.0)
Hemoglobin: 14.8 g/dL (ref 13.0–17.0)
LYMPHS ABS: 1.2 10*3/uL (ref 0.7–4.0)
Lymphocytes Relative: 21.6 % (ref 12.0–46.0)
MCHC: 34 g/dL (ref 30.0–36.0)
MCV: 85.9 fl (ref 78.0–100.0)
MONO ABS: 0.5 10*3/uL (ref 0.1–1.0)
MONOS PCT: 8.6 % (ref 3.0–12.0)
Neutro Abs: 3.7 10*3/uL (ref 1.4–7.7)
Neutrophils Relative %: 68 % (ref 43.0–77.0)
Platelets: 209 10*3/uL (ref 150.0–400.0)
RBC: 5.04 Mil/uL (ref 4.22–5.81)
RDW: 13.3 % (ref 11.5–14.6)
WBC: 5.4 10*3/uL (ref 4.5–10.5)

## 2013-05-09 LAB — BASIC METABOLIC PANEL
BUN: 19 mg/dL (ref 6–23)
CO2: 28 mEq/L (ref 19–32)
Calcium: 9.5 mg/dL (ref 8.4–10.5)
Chloride: 105 mEq/L (ref 96–112)
Creatinine, Ser: 1.3 mg/dL (ref 0.4–1.5)
GFR: 56.98 mL/min — ABNORMAL LOW (ref >60.00)
Glucose, Bld: 92 mg/dL (ref 70–99)
POTASSIUM: 4.2 meq/L (ref 3.5–5.1)
Sodium: 139 mEq/L (ref 135–145)

## 2013-05-09 LAB — PSA: PSA: 2.7 ng/mL (ref 0.10–4.00)

## 2013-05-09 LAB — ALT: ALT: 34 U/L (ref 0–53)

## 2013-05-09 LAB — AST: AST: 28 U/L (ref 0–37)

## 2013-05-09 MED ORDER — LISINOPRIL 5 MG PO TABS: 5.0000 mg | ORAL_TABLET | Freq: Every day | ORAL | Status: DC

## 2013-05-09 NOTE — Progress Notes (Signed)
Pre visit review using our clinic review tool, if applicable. No additional management support is needed unless otherwise documented below in the visit note. 

## 2013-05-09 NOTE — Assessment & Plan Note (Signed)
Status post cardiac arrest, now doing great. Closely follow up by cardiology

## 2013-05-09 NOTE — Assessment & Plan Note (Addendum)
Ambulatory BPs are checked at rehabilitation,   they range from 118/72-140/80, today 142/90, pulse 60 Increase lisinopril 2.5 to 5 mg qd  rec to call if BP not between 110/60, 140/85

## 2013-05-09 NOTE — Assessment & Plan Note (Signed)
Last cholesterol panel was satisfactory, recheck LFTs

## 2013-05-09 NOTE — Assessment & Plan Note (Addendum)
Td 07 Had a flu shot  PNM shot today shingles shot 2014 S/P 3 Cscopes ---> Last Cscope 04-2010, serratous adenoma, next 5 years . hemocult neg today diet exercise discussed  Labs , UA

## 2013-05-09 NOTE — Assessment & Plan Note (Signed)
Pre diabetes, check the A1c, diet and exercise have improved

## 2013-05-09 NOTE — Progress Notes (Signed)
Subjective:    Patient ID: Tyler Madden, male    DOB: 01-Aug-1950, 63 y.o.   MRN: [id number]  HPI CPX Since the last time he was here, he survived a cardiac arrest, fortunately  he is doing very well.  Past Medical History  Diagnosis Date  . Hyperlipidemia   . Hypertension   . ED (erectile dysfunction)   . Diabetes mellitus, type II      2011---> A1C 6.2    . CAD (coronary artery disease)     a. OOH VF arrest/NSTEMI 12/2012 - total occ distal LCx s/p DES, severe mid & distal RCA stenosis s/p DES x2. 2D Echo with EF 65-70%.  . Cardiac arrest     a. OOH VF arrest 12/2012 in setting of NSTEMI, successfully resuscitated to alertness before arrival to hospital.  . Hypokalemia   . Acute renal insufficiency     a. Cr 1.6 on presentation to hospital after OOH arrest, improved.   Past Surgical History  Procedure Laterality Date  . Vasectomy    . Hydrocele excision    . Cardiac catheterization  12/2012   History   Social History  . Marital Status: Married    Spouse Name: N/A    Number of Children: 2  . Years of Education: N/A   Occupational History  . sales, machinery    Social History Main Topics  . Smoking status: Former Smoker    Types: Cigarettes  . Smokeless tobacco: Never Used  . Alcohol Use: No     Comment: socially, rare  . Drug Use: No  . Sexual Activity: Not on file   Other Topics Concern  . Not on file   Social History Narrative   Married, children --2 daughters     Family History  Problem Relation Age of Onset  . Diabetes Mother   . Hypertension Mother   . Coronary artery disease Mother     CABG  . Diabetes Father   . Alcohol abuse Father     CIRRHOSIS  . Stroke Neg Hx   . Cancer Sister     type, lung?  . Colon cancer Neg Hx   . Prostate cancer Neg Hx     Review of Systems Diet-- very healthy now  Exercise-- cardiac rehab, takes walks  No recent  CP, SOB, lower extremity edema Denies  nausea, vomiting diarrhea Denies  blood in the  stools (-) cough, sputum production, (-) wheezing  No dysuria, gross hematuria, difficulty urinating   No anxiety, depression      Objective:   Physical Exam BP 142/90  Pulse 60  Temp(Src) 98.2 F (36.8 C)  Ht 5' 10.1" (1.781 m)  Wt 191 lb (86.637 kg)  BMI 27.31 kg/m2  SpO2 100%  General -- alert, well-developed, NAD.  Neck --no thyromegaly , normal carotid pulse  HEENT-- Not pale.   Lungs -- normal respiratory effort, no intercostal retractions, no accessory muscle use, and normal breath sounds.  Heart-- normal rate, regular rhythm, no murmur.  Abdomen-- Not distended, good bowel sounds,soft, slt tender at L side w/o mass or rebound. No mass,organomegaly. Rectal-- No external abnormalities noted. Normal sphincter tone. No rectal masses or tenderness. Brown stool, Hemoccult negative  Prostate--Prostate gland firm and smooth, no enlargement, nodularity, tenderness, mass, asymmetry or induration. Extremities-- no pretibial edema bilaterally  Neurologic--  alert & oriented X3. Speech normal, gait normal, strength normal in all extremities.  psych-- Cognition and judgment appear intact. Cooperative with normal attention span and  concentration. No anxious or depressed appearing.      Assessment & Plan:   Mild tenderness to palpation at the left abdomen, he is asymptomatic otherwise. Observation, check a UA

## 2013-05-09 NOTE — Patient Instructions (Signed)
Get your blood work before you leave   Next visit is for routine check up regards blood sugar, hypertension, cholesterol  in 6 months ,  fasting Please make an appointment

## 2013-05-10 ENCOUNTER — Encounter (HOSPITAL_COMMUNITY)
Admission: RE | Admit: 2013-05-10 | Discharge: 2013-05-10 | Disposition: A | Discharge status: Home / Self Care | Payer: BC Managed Care – PPO | Source: Ambulatory Visit | Attending: Cardiology | Admitting: Cardiology

## 2013-05-10 LAB — URINALYSIS, ROUTINE W REFLEX MICROSCOPIC
Bilirubin Urine: NEGATIVE
Hgb urine dipstick: NEGATIVE
Ketones, ur: NEGATIVE
Leukocytes, UA: NEGATIVE
Nitrite: NEGATIVE
PH: 6.5 (ref 5.0–8.0)
Total Protein, Urine: NEGATIVE
URINE GLUCOSE: NEGATIVE
Urobilinogen, UA: 0.2 (ref 0.0–1.0)

## 2013-05-10 LAB — HEMOGLOBIN A1C: Hgb A1c MFr Bld: 5.7 % (ref 4.6–6.5)

## 2013-05-12 ENCOUNTER — Encounter (HOSPITAL_COMMUNITY)
Admission: RE | Admit: 2013-05-12 | Discharge: 2013-05-12 | Disposition: A | Discharge status: Home / Self Care | Payer: BC Managed Care – PPO | Source: Ambulatory Visit | Attending: Cardiology | Admitting: Cardiology

## 2013-05-12 ENCOUNTER — Encounter (HOSPITAL_COMMUNITY): Payer: Self-pay

## 2013-05-12 NOTE — Progress Notes (Signed)
Pt graduated from cardiac rehab program today.  Medication list reconciled.  PHQ9 score- 0 .  Pt has made significant lifestyle changes and should be commended for his success. Pt plans [facility].

## 2013-08-07 ENCOUNTER — Other Ambulatory Visit (HOSPITAL_COMMUNITY): Payer: Self-pay | Admitting: Physician Assistant

## 2013-09-04 ENCOUNTER — Other Ambulatory Visit (HOSPITAL_COMMUNITY): Payer: Self-pay | Admitting: Cardiovascular Disease

## 2013-09-20 ENCOUNTER — Ambulatory Visit (INDEPENDENT_AMBULATORY_CARE_PROVIDER_SITE_OTHER): Payer: BC Managed Care – PPO | Admitting: Cardiovascular Disease

## 2013-09-20 ENCOUNTER — Encounter: Payer: Self-pay | Admitting: Cardiovascular Disease

## 2013-09-20 VITALS — BP 109/70 | HR 61 | Ht 70.0 in | Wt 186.0 lb

## 2013-09-20 DIAGNOSIS — Z0000 Encounter for general adult medical examination without abnormal findings: Secondary | ICD-10-CM

## 2013-09-20 DIAGNOSIS — I2510 Atherosclerotic heart disease of native coronary artery without angina pectoris: Secondary | ICD-10-CM

## 2013-09-20 DIAGNOSIS — E785 Hyperlipidemia, unspecified: Secondary | ICD-10-CM

## 2013-09-20 NOTE — Progress Notes (Signed)
HPI:  63 year old gentleman presenting for followup evaluation. The patient has coronary artery disease and sustained an out of hospital ventricular fibrillation arrest in September 2014. He was playing tennis at the time and underwent CPR and defibrillation. He underwent urgent cardiac catheterization demonstrating severe two-vessel coronary artery disease and he was treated with drug-eluting stents in the left circumflex and right coronary arteries. His left ventricular function was normal with an ejection fraction of 65-70%.  The patient is doing well. He has no cardiac related symptoms. He specifically denies chest pain, chest pressure, shortness of breath, edema, or palpitations. He initially noted easy bruising on dual antiplatelet therapy, but this has not been an issue over recent months. The patient is physically active without exertional symptoms  Outpatient Encounter Prescriptions as of 09/20/2013  Medication Sig  . aspirin 81 MG EC tablet Take 81 mg by mouth daily.    Marland Kitchen atorvastatin (LIPITOR) 80 MG tablet TAKE 1 TABLET (80 MG TOTAL) BY MOUTH DAILY.  Marland Kitchen lisinopril (PRINIVIL,ZESTRIL) 5 MG tablet Take 10 mg by mouth daily.  . metoprolol tartrate (LOPRESSOR) 25 MG tablet Take 0.5 tablets (12.5 mg total) by mouth 2 (two) times daily.  . nitroGLYCERIN (NITROSTAT) 0.4 MG SL tablet Place 1 tablet (0.4 mg total) under the tongue every 5 (five) minutes as needed for chest pain (up to 3 doses).  . Ticagrelor (BRILINTA) 90 MG TABS tablet Take 1 tablet (90 mg total) by mouth 2 (two) times daily.    Allergies  Allergen Reactions  . Codeine     REACTION: nausea  . Tadalafil     REACTION: sob    Past Medical History  Diagnosis Date  . Hyperlipidemia   . Hypertension   . ED (erectile dysfunction)   . Diabetes mellitus, type II      2011---> A1C 6.2    . CAD (coronary artery disease)     a. OOH VF arrest/NSTEMI 12/2012 - total occ distal LCx s/p DES, severe mid & distal RCA stenosis s/p  DES x2. 2D Echo with EF 65-70%.  . Cardiac arrest     a. OOH VF arrest 12/2012 in setting of NSTEMI, successfully resuscitated to alertness before arrival to hospital.  . Hypokalemia   . Acute renal insufficiency     a. Cr 1.6 on presentation to hospital after OOH arrest, improved.  . Prediabetes 03/10/2010    Qualifier: Diagnosis of  By: Julianne Handler CMA, Danielle      ROS: Negative except as per HPI  BP 109/70  Pulse 61  Ht 5\' 10"  (1.778 m)  Wt 84.369 kg (186 lb)  BMI 26.69 kg/m2  PHYSICAL EXAM: Pt is alert and oriented, NAD HEENT: normal Neck: JVP - normal, carotids 2+= without bruits Lungs: CTA bilaterally CV: RRR without murmur or gallop Abd: soft, NT, Positive BS, no hepatomegaly Ext: no C/C/E, distal pulses intact and equal Skin: warm/dry no rash  EKG:  Normal sinus rhythm 61 beats per minute, within normal limits.  ASSESSMENT AND PLAN: 1. Coronary artery disease, native vessel. The patient is stable without symptoms of angina. I would be inclined to keep him on long-term dual antiplatelet therapy for at least 24-36 months as long as this was well-tolerated. I think his risk is a little higher than normal because of multiple drug-eluting stents placed at the time of acute coronary syndrome. If he needs to temporarily interrupt his antiplatelet therapy, this would be a reasonable consideration in the short-term but I would like him to  at least remain on aspirin if this is ever required. I will plan on seeing him back in one year at which time we can readdress long-term medications.  2. Hypertension. Blood pressure well controlled on combination of lisinopril and metoprolol.  3. Hyperlipidemia. Lipids reviewed from October 2014. He remains on high-dose atorvastatin. Will order repeat lipids and LFTs in October.  Tonny BollmanMichael Dhruti Ghuman 09/20/2013 5:25 PM

## 2013-09-20 NOTE — Patient Instructions (Addendum)
**Note De-Identified Lutricia Widjaja Obfuscation** Your physician recommends that you continue on your current medications as directed. Please refer to the Current Medication list given to you today.  Your physician recommends that you return for lab work in: October 5, do not eat or drink after midnight the night before labs are drawn  Your physician wants you to follow-up in: 1 year. You will receive a reminder letter in the mail two months in advance. If you don't receive a letter, please call our office to schedule the follow-up appointment.

## 2013-11-02 ENCOUNTER — Other Ambulatory Visit (HOSPITAL_COMMUNITY): Payer: Self-pay | Admitting: Cardiovascular Disease

## 2013-11-10 ENCOUNTER — Ambulatory Visit: Payer: BC Managed Care – PPO | Admitting: Internal Medicine

## 2013-12-25 ENCOUNTER — Other Ambulatory Visit (HOSPITAL_COMMUNITY): Payer: Self-pay | Admitting: Physician Assistant

## 2013-12-25 ENCOUNTER — Other Ambulatory Visit: Payer: Self-pay | Admitting: *Deleted

## 2014-01-15 ENCOUNTER — Encounter: Payer: Self-pay | Admitting: Internal Medicine

## 2014-01-15 ENCOUNTER — Ambulatory Visit (INDEPENDENT_AMBULATORY_CARE_PROVIDER_SITE_OTHER): Payer: BC Managed Care – PPO | Admitting: Internal Medicine

## 2014-01-15 VITALS — BP 126/62 | HR 47 | Temp 97.2°F | Wt 193.1 lb

## 2014-01-15 DIAGNOSIS — I2583 Coronary atherosclerosis due to lipid rich plaque: Secondary | ICD-10-CM

## 2014-01-15 DIAGNOSIS — R7309 Other abnormal glucose: Secondary | ICD-10-CM

## 2014-01-15 DIAGNOSIS — I10 Essential (primary) hypertension: Secondary | ICD-10-CM

## 2014-01-15 DIAGNOSIS — R7303 Prediabetes: Secondary | ICD-10-CM

## 2014-01-15 DIAGNOSIS — I2510 Atherosclerotic heart disease of native coronary artery without angina pectoris: Secondary | ICD-10-CM

## 2014-01-15 DIAGNOSIS — Z23 Encounter for immunization: Secondary | ICD-10-CM

## 2014-01-15 MED ORDER — ATORVASTATIN CALCIUM 80 MG PO TABS: ORAL_TABLET | ORAL | Status: DC

## 2014-01-15 MED ORDER — LISINOPRIL 5 MG PO TABS: 5.0000 mg | ORAL_TABLET | Freq: Every day | ORAL | Status: DC

## 2014-01-15 MED ORDER — TICAGRELOR 90 MG PO TABS: ORAL_TABLET | ORAL | Status: DC

## 2014-01-15 MED ORDER — METOPROLOL TARTRATE 25 MG PO TABS: 12.5000 mg | ORAL_TABLET | Freq: Two times a day (BID) | ORAL | Status: DC

## 2014-01-15 NOTE — Assessment & Plan Note (Signed)
Doing great with lifestyle, labs 

## 2014-01-15 NOTE — Assessment & Plan Note (Signed)
Asymptomatic, cardiology note reviewed, he needs to remain on dual antiplatelet therapy.

## 2014-01-15 NOTE — Assessment & Plan Note (Signed)
Blood pressure is under excellent control. Not sure if he is taking lisinopril 5 mg one or 2 tablets daily.  Prescription refills for one tablet daily,If he is taking 2 tablets daily he will let me know.

## 2014-01-15 NOTE — Progress Notes (Signed)
Subjective:    Patient ID: Tyler Madden, male    DOB: December 07, 1950, 63 y.o.   MRN: [id number]  DOS:  01/15/2014 Type of visit - description : ROV Interval history: In general doing well. Good medication compliance although he is not sure if he takes lisinopril 5 mg one tablet or 2 tablets daily. Needs  medications rf. Labs reviewed, due for a BMP A1c and TSH. Ambulatory BPs usually 120/80 No ambulatory CBGs appear  ROS Denies chest pain or difficulty breathing No nausea, vomiting, diarrhea. Diet is healthy, he has been unable to exercise as much as before due to lack of time   Past Medical History  Diagnosis Date  . Hyperlipidemia   . Hypertension   . ED (erectile dysfunction)   . Diabetes mellitus, type II      2011---> A1C 6.2    . CAD (coronary artery disease)     a. OOH VF arrest/NSTEMI 12/2012 - total occ distal LCx s/p DES, severe mid & distal RCA stenosis s/p DES x2. 2D Echo with EF 65-70%.  . Cardiac arrest     a. OOH VF arrest 12/2012 in setting of NSTEMI, successfully resuscitated to alertness before arrival to hospital.  . Hypokalemia   . Acute renal insufficiency     a. Cr 1.6 on presentation to hospital after OOH arrest, improved.  . Prediabetes 03/10/2010    Qualifier: Diagnosis of  By: Julianne Handler CMA, Danielle      Past Surgical History  Procedure Laterality Date  . Vasectomy    . Hydrocele excision    . Cardiac catheterization  12/2012    History   Social History  . Marital Status: Married    Spouse Name: N/A    Number of Children: 2  . Years of Education: N/A   Occupational History  . sales, machinery    Social History Main Topics  . Smoking status: Former Smoker    Types: Cigarettes  . Smokeless tobacco: Never Used  . Alcohol Use: No     Comment: socially, rare  . Drug Use: No  . Sexual Activity: Not on file   Other Topics Concern  . Not on file   Social History Narrative   Married, children --2 daughters          Medication List       This list is accurate as of: 01/15/14 11:59 PM.  Always use your most recent med list.               aspirin 81 MG EC tablet  Take 81 mg by mouth daily.     atorvastatin 80 MG tablet  Commonly known as:  LIPITOR  TAKE 1 TABLET (80 MG TOTAL) BY MOUTH DAILY.     lisinopril 5 MG tablet  Commonly known as:  PRINIVIL,ZESTRIL  Take 1 tablet (5 mg total) by mouth daily.     metoprolol tartrate 25 MG tablet  Commonly known as:  LOPRESSOR  Take 0.5 tablets (12.5 mg total) by mouth 2 (two) times daily.     nitroGLYCERIN 0.4 MG SL tablet  Commonly known as:  NITROSTAT  Place 1 tablet (0.4 mg total) under the tongue every 5 (five) minutes as needed for chest pain (up to 3 doses).     ticagrelor 90 MG Tabs tablet  Commonly known as:  BRILINTA  TAKE 1 TABLET (90 MG TOTAL) BY MOUTH TWO   TIMES DAILY.           Objective:  Physical Exam BP 126/62  Pulse 47  Temp(Src) 97.2 F (36.2 C) (Oral)  Wt 193 lb 2 oz (87.601 kg)  SpO2 98%  General -- alert, well-developed, NAD.   Lungs -- normal respiratory effort, no intercostal retractions, no accessory muscle use, and normal breath sounds.  Heart-- normal rate, regular rhythm, no murmur.   Extremities-- no pretibial edema bilaterally  Neurologic--  alert & oriented X3. Speech normal, gait appropriate for age, strength symmetric and appropriate for age.  Psych-- Cognition and judgment appear intact. Cooperative with normal attention span and concentration. No anxious or depressed appearing.       Assessment & Plan:

## 2014-01-15 NOTE — Patient Instructions (Signed)
Get your blood work before you leave    Please come back to the office by 04-2013 for a physical exam. Come back fasting

## 2014-01-15 NOTE — Progress Notes (Signed)
Pre visit review using our clinic review tool, if applicable. No additional management support is needed unless otherwise documented below in the visit note. 

## 2014-01-16 LAB — BASIC METABOLIC PANEL
BUN: 16 mg/dL (ref 6–23)
CHLORIDE: 105 meq/L (ref 96–112)
CO2: 26 mEq/L (ref 19–32)
Calcium: 9.3 mg/dL (ref 8.4–10.5)
Creatinine, Ser: 1.4 mg/dL (ref 0.4–1.5)
GFR: 56.37 mL/min — AB (ref >60.00)
Glucose, Bld: 84 mg/dL (ref 70–99)
POTASSIUM: 4.2 meq/L (ref 3.5–5.1)
Sodium: 138 mEq/L (ref 135–145)

## 2014-01-16 LAB — HEMOGLOBIN A1C: Hgb A1c MFr Bld: 5.8 % (ref 4.6–6.5)

## 2014-01-16 LAB — TSH: TSH: 1.48 u[IU]/mL (ref 0.35–4.50)

## 2014-01-29 ENCOUNTER — Other Ambulatory Visit (INDEPENDENT_AMBULATORY_CARE_PROVIDER_SITE_OTHER): Payer: BC Managed Care – PPO | Admitting: *Deleted

## 2014-01-29 DIAGNOSIS — E785 Hyperlipidemia, unspecified: Secondary | ICD-10-CM

## 2014-01-29 LAB — HEPATIC FUNCTION PANEL
ALBUMIN: 4.2 g/dL (ref 3.5–5.2)
ALT: 28 U/L (ref 0–53)
AST: 23 U/L (ref 0–37)
Alkaline Phosphatase: 55 U/L (ref 39–117)
BILIRUBIN DIRECT: 0.1 mg/dL (ref 0.0–0.3)
Total Bilirubin: 0.7 mg/dL (ref 0.2–1.2)
Total Protein: 7.1 g/dL (ref 6.0–8.3)

## 2014-01-29 LAB — LIPID PANEL
Cholesterol: 155 mg/dL (ref 0–200)
HDL: 46.1 mg/dL (ref >39.00)
LDL Cholesterol: 89 mg/dL (ref 0–99)
NonHDL: 108.9
Total CHOL/HDL Ratio: 3
Triglycerides: 100 mg/dL (ref 0.0–149.0)
VLDL: 20 mg/dL (ref 0.0–40.0)

## 2014-01-31 ENCOUNTER — Other Ambulatory Visit (HOSPITAL_COMMUNITY): Payer: Self-pay | Admitting: Physician Assistant

## 2014-04-05 ENCOUNTER — Encounter (HOSPITAL_COMMUNITY): Payer: Self-pay | Admitting: Cardiovascular Disease

## 2014-09-27 ENCOUNTER — Ambulatory Visit: Payer: Medicare Other | Admitting: Physician Assistant

## 2014-10-09 ENCOUNTER — Ambulatory Visit (INDEPENDENT_AMBULATORY_CARE_PROVIDER_SITE_OTHER): Payer: Medicare Other | Admitting: Cardiovascular Disease

## 2014-10-09 ENCOUNTER — Encounter: Payer: Self-pay | Admitting: Cardiovascular Disease

## 2014-10-09 VITALS — BP 130/82 | HR 55 | Ht 70.0 in | Wt 199.6 lb

## 2014-10-09 DIAGNOSIS — I2510 Atherosclerotic heart disease of native coronary artery without angina pectoris: Secondary | ICD-10-CM

## 2014-10-09 DIAGNOSIS — I10 Essential (primary) hypertension: Secondary | ICD-10-CM | POA: Diagnosis not present

## 2014-10-09 DIAGNOSIS — I2583 Coronary atherosclerosis due to lipid rich plaque: Principal | ICD-10-CM

## 2014-10-09 LAB — HEPATIC FUNCTION PANEL
ALBUMIN: 4.5 g/dL (ref 3.5–5.2)
ALT: 30 U/L (ref 0–53)
AST: 23 U/L (ref 0–37)
Alkaline Phosphatase: 61 U/L (ref 39–117)
BILIRUBIN TOTAL: 0.8 mg/dL (ref 0.2–1.2)
Bilirubin, Direct: 0.1 mg/dL (ref 0.0–0.3)
Total Protein: 7.1 g/dL (ref 6.0–8.3)

## 2014-10-09 LAB — LIPID PANEL
CHOLESTEROL: 179 mg/dL (ref 0–200)
HDL: 49.4 mg/dL (ref >39.00)
LDL CALC: 106 mg/dL — AB (ref 0–99)
NonHDL: 129.6
TRIGLYCERIDES: 119 mg/dL (ref 0.0–149.0)
Total CHOL/HDL Ratio: 4
VLDL: 23.8 mg/dL (ref 0.0–40.0)

## 2014-10-09 MED ORDER — TICAGRELOR 60 MG PO TABS: 60.0000 mg | ORAL_TABLET | Freq: Two times a day (BID) | ORAL | Status: AC

## 2014-10-09 NOTE — Patient Instructions (Addendum)
Medication Instructions:  Your physician has recommended you make the following change in your medication:  1. DECREASE Brilinta to 60mg  take one by mouth twice a day 2. STOP Aspirin when you start lower dose of Brilinta  Labwork: Your physician recommends that you have lab work today: LIPID and LIVER  Testing/Procedures: Your physician has requested that you have an exercise tolerance test. For further information please visit [email or web address] Please also follow instruction sheet, as given.  Follow-Up: Your physician wants you to follow-up in: 1 YEAR with Dr Excell Seltzer.  You will receive a reminder letter in the mail two months in advance. If you don't receive a letter, please call our office to schedule the follow-up appointment.   Any Other Special Instructions Will Be Listed Below (If Applicable).

## 2014-10-09 NOTE — Progress Notes (Signed)
Cardiology Office Note Date:  10/11/2014   ID:  Tyler, Madden November 08, 1950, MRN [id number]  PCP:  Willow Ora, MD  Cardiologist:  Tonny Bollman, MD    Chief Complaint  Patient presents with  . Coronary Artery Disease    History of Present Illness: Tyler Madden is a 64 y.o. male who presents for followup evaluation. The patient has coronary artery disease and sustained an out of hospital ventricular fibrillation arrest in September 2014. He was playing tennis at the time and underwent CPR and defibrillation. He underwent urgent cardiac catheterization demonstrating severe two-vessel coronary artery disease and he was treated with drug-eluting stents in the left circumflex and right coronary arteries. His left ventricular function was normal with an ejection fraction of 65-70%.  Overall he feels well. He denies chest pain or pressure, shortness of breath, edema, or heart palpitations. His exercise has become inconsistent due to her busy schedule and other obligations. He's gained approximately 10 pounds since his last visit here.  Past Medical History  Diagnosis Date  . Hyperlipidemia   . Hypertension   . ED (erectile dysfunction)   . Diabetes mellitus, type II      2011---> A1C 6.2    . CAD (coronary artery disease)     a. OOH VF arrest/NSTEMI 12/2012 - total occ distal LCx s/p DES, severe mid & distal RCA stenosis s/p DES x2. 2D Echo with EF 65-70%.  . Cardiac arrest     a. OOH VF arrest 12/2012 in setting of NSTEMI, successfully resuscitated to alertness before arrival to hospital.  . Hypokalemia   . Acute renal insufficiency     a. Cr 1.6 on presentation to hospital after OOH arrest, improved.  . Prediabetes 03/10/2010    Qualifier: Diagnosis of  By: Julianne Handler CMA, Danielle      Past Surgical History  Procedure Laterality Date  . Vasectomy    . Hydrocele excision    . Cardiac catheterization  12/2012  . Left heart catheterization with coronary angiogram N/A 01/02/2013   Procedure: LEFT HEART CATHETERIZATION WITH CORONARY ANGIOGRAM;  Surgeon: Micheline Chapman, MD;  Location: [facility] CATH LAB;  Service: Cardiovascular;  Laterality: N/A;  . Percutaneous coronary stent intervention (pci-s)  01/01/2013    Procedure: PERCUTANEOUS CORONARY STENT INTERVENTION (PCI-S);  Surgeon: Micheline Chapman, MD;  Location: [facility] CATH LAB;  Service: Cardiovascular;;    Current Outpatient Prescriptions  Medication Sig Dispense Refill  . atorvastatin (LIPITOR) 80 MG tablet TAKE 1 TABLET (80 MG TOTAL) BY MOUTH DAILY. 90 tablet 3  . lisinopril (PRINIVIL,ZESTRIL) 5 MG tablet Take 1 tablet (5 mg total) by mouth daily. 90 tablet 3  . metoprolol tartrate (LOPRESSOR) 25 MG tablet Take 0.5 tablets (12.5 mg total) by mouth 2 (two) times daily. 90 tablet 3  . nitroGLYCERIN (NITROSTAT) 0.4 MG SL tablet Place 1 tablet (0.4 mg total) under the tongue every 5 (five) minutes as needed for chest pain (up to 3 doses). 25 tablet 4  . ticagrelor (BRILINTA) 60 MG TABS tablet Take 1 tablet (60 mg total) by mouth 2 (two) times daily. 60 tablet 11   No current facility-administered medications for this visit.    Allergies:   Codeine and Tadalafil   Social History:  The patient  reports that he has quit smoking. His smoking use included Cigarettes. He has never used smokeless tobacco. He reports that he does not drink alcohol or use illicit drugs.   Family History:  The patient's  family history  includes Alcohol abuse in his father; Cancer in his sister; Coronary artery disease in his mother; Diabetes in his father and mother; Hypertension in his mother. There is no history of Stroke, Colon cancer, or Prostate cancer.   ROS:  Please see the history of present illness. All other systems are reviewed and negative.   PHYSICAL EXAM: VS:  BP 130/82 mmHg  Pulse 55  Ht 5\' 10"  (1.778 m)  Wt 199 lb 9.6 oz (90.538 kg)  BMI 28.64 kg/m2 , BMI Body mass index is 28.64 kg/(m^2). GEN: Well nourished, well developed, in no  acute distress HEENT: normal Neck: no JVD, no masses. No carotid bruits Cardiac: RRR without murmur or gallop                Respiratory:  clear to auscultation bilaterally, normal work of breathing GI: soft, nontender, nondistended, + BS MS: no deformity or atrophy Ext: no pretibial edema, pedal pulses 2+= bilaterally Skin: warm and dry, no rash Neuro:  Strength and sensation are intact Psych: euthymic mood, full affect  EKG:  EKG is ordered today. The ekg ordered today shows sinus bradycardia 55 bpm  Recent Labs: 01/15/2014: BUN 16; Creatinine, Ser 1.4; Potassium 4.2; Sodium 138; TSH 1.48 10/09/2014: ALT 30   Lipid Panel     Component Value Date/Time   CHOL 179 10/09/2014 1048   TRIG 119.0 10/09/2014 1048   HDL 49.40 10/09/2014 1048   CHOLHDL 4 10/09/2014 1048   VLDL 23.8 10/09/2014 1048   LDLCALC 106* 10/09/2014 1048      Wt Readings from Last 3 Encounters:  10/09/14 199 lb 9.6 oz (90.538 kg)  01/15/14 193 lb 2 oz (87.601 kg)  09/20/13 186 lb (84.369 kg)    ASSESSMENT AND PLAN: 1.  CAD, native vessel: the patient is stable without symptoms of angina. He is approaching 2 years out from his non-ST elevation infarction with multivessel stenting. We discussed the pros and cons of continued dual antiplatelet therapy, including the DAPT Trial. After discussion of relative pros and cons of various strategies, will change him to Brilinta 60 mg BID and stop ASA. As he had minimal symptoms predating his VF arrest/MI, will arrange an exercise treadmill study to evaluate for potential ischemia.   2. HTN: well-controlled on current medical therapy.  3. Hyperlipidemia: lipids reviewed from last year. He continues on atorvastatin 80 mg. Will repeat labs today.   Current medicines are reviewed with the patient today.  The patient does not have concerns regarding medicines.  Labs/ tests ordered today include:   Orders Placed This Encounter  Procedures  . Lipid panel  . Hepatic  function panel  . Exercise Tolerance Test  . EKG 12-Lead    Disposition:   FU one year.   Tyler Bi, MD  10/11/2014 5:48 AM    [facility] [address] [place], [place], [place]  [address] Phone: [phone]; Fax: [phone]

## 2014-10-22 ENCOUNTER — Other Ambulatory Visit: Payer: Self-pay

## 2014-11-19 ENCOUNTER — Other Ambulatory Visit: Payer: Self-pay | Admitting: Physician Assistant

## 2014-11-19 ENCOUNTER — Encounter: Payer: Medicare Other | Admitting: Physician Assistant

## 2014-11-19 ENCOUNTER — Ambulatory Visit (INDEPENDENT_AMBULATORY_CARE_PROVIDER_SITE_OTHER): Payer: Medicare Other

## 2014-11-19 DIAGNOSIS — I10 Essential (primary) hypertension: Secondary | ICD-10-CM | POA: Diagnosis not present

## 2014-11-19 DIAGNOSIS — I2510 Atherosclerotic heart disease of native coronary artery without angina pectoris: Secondary | ICD-10-CM

## 2014-11-19 DIAGNOSIS — I2583 Coronary atherosclerosis due to lipid rich plaque: Principal | ICD-10-CM

## 2014-11-19 DIAGNOSIS — R079 Chest pain, unspecified: Secondary | ICD-10-CM

## 2014-11-19 DIAGNOSIS — R9439 Abnormal result of other cardiovascular function study: Secondary | ICD-10-CM

## 2014-11-19 LAB — EXERCISE TOLERANCE TEST
CHL CUP MPHR: 155 {beats}/min
CSEPEW: 11.7 METS
CSEPPHR: 137 {beats}/min
Exercise duration (min): 10 min
Percent HR: 88 %
RPE: 18
Rest HR: 50 {beats}/min

## 2014-11-19 NOTE — Patient Instructions (Signed)
Medication Instructions:    Labwork:   Testing/Procedures: MYOVIEW  Follow-Up:   Any Other Special Instructions Will Be Listed Below (If Applicable).

## 2014-11-21 ENCOUNTER — Telehealth (HOSPITAL_COMMUNITY): Payer: Self-pay

## 2014-11-21 NOTE — Telephone Encounter (Signed)
Patient given detailed instructions per Myocardial Perfusion Study Information Sheet for test on 11-26-2014 at 0815. Patient Notified to arrive 15 minutes early, and that it is imperative to arrive on time for appointment to keep from having the test rescheduled. Patient verbalized understanding. Ailish Prospero A   

## 2014-11-27 ENCOUNTER — Encounter: Payer: Self-pay | Admitting: Physician Assistant

## 2014-11-27 ENCOUNTER — Ambulatory Visit (HOSPITAL_COMMUNITY): Payer: Medicare Other | Attending: Physician Assistant

## 2014-11-27 DIAGNOSIS — R079 Chest pain, unspecified: Secondary | ICD-10-CM | POA: Diagnosis not present

## 2014-11-27 LAB — MYOCARDIAL PERFUSION IMAGING
CHL CUP NUCLEAR SRS: 2
CHL CUP NUCLEAR SSS: 2
CSEPED: 11 min
CSEPHR: 87 %
Estimated workload: 13.3 METS
Exercise duration (sec): 1 s
LV dias vol: 84 mL
LV sys vol: 29 mL
MPHR: 155 {beats}/min
Peak HR: 136 {beats}/min
RATE: 0.33
RPE: 18
Rest HR: 51 {beats}/min
SDS: 0
TID: 0.78

## 2014-11-27 MED ORDER — TECHNETIUM TC 99M SESTAMIBI GENERIC - CARDIOLITE
10.1000 | Freq: Once | INTRAVENOUS | Status: AC | PRN
  Administered 2014-11-27: 10.1 via INTRAVENOUS

## 2014-11-27 MED ORDER — TECHNETIUM TC 99M SESTAMIBI GENERIC - CARDIOLITE
32.9000 | Freq: Once | INTRAVENOUS | Status: AC | PRN
  Administered 2014-11-27: 32.9 via INTRAVENOUS

## 2014-11-28 ENCOUNTER — Telehealth: Payer: Self-pay | Admitting: Physician Assistant

## 2014-11-28 NOTE — Telephone Encounter (Signed)
New message ° ° ° ° °Pt returning call regarding test results.  Please call to discuss. °

## 2014-11-28 NOTE — Telephone Encounter (Signed)
pt notified of myoview results by phone with verbal yunderstanding

## 2015-03-04 ENCOUNTER — Encounter: Payer: Self-pay | Admitting: Internal Medicine

## 2015-03-05 ENCOUNTER — Other Ambulatory Visit: Payer: Self-pay

## 2015-03-05 MED ORDER — ATORVASTATIN CALCIUM 80 MG PO TABS: 80.0000 mg | ORAL_TABLET | Freq: Every day | ORAL | Status: DC

## 2015-03-05 MED ORDER — LISINOPRIL 5 MG PO TABS: 5.0000 mg | ORAL_TABLET | Freq: Every day | ORAL | Status: DC

## 2015-03-05 MED ORDER — METOPROLOL TARTRATE 25 MG PO TABS: 12.5000 mg | ORAL_TABLET | Freq: Two times a day (BID) | ORAL | Status: DC

## 2015-04-03 ENCOUNTER — Encounter: Payer: Self-pay | Admitting: Internal Medicine

## 2015-04-03 ENCOUNTER — Ambulatory Visit (INDEPENDENT_AMBULATORY_CARE_PROVIDER_SITE_OTHER): Payer: Medicare Other | Admitting: Internal Medicine

## 2015-04-03 VITALS — BP 124/72 | HR 82 | Temp 98.1°F | Ht 70.0 in | Wt 200.4 lb

## 2015-04-03 DIAGNOSIS — Z09 Encounter for follow-up examination after completed treatment for conditions other than malignant neoplasm: Secondary | ICD-10-CM

## 2015-04-03 DIAGNOSIS — I10 Essential (primary) hypertension: Secondary | ICD-10-CM | POA: Diagnosis not present

## 2015-04-03 DIAGNOSIS — Z0000 Encounter for general adult medical examination without abnormal findings: Secondary | ICD-10-CM

## 2015-04-03 DIAGNOSIS — Z23 Encounter for immunization: Secondary | ICD-10-CM

## 2015-04-03 DIAGNOSIS — I2510 Atherosclerotic heart disease of native coronary artery without angina pectoris: Secondary | ICD-10-CM

## 2015-04-03 DIAGNOSIS — R7303 Prediabetes: Secondary | ICD-10-CM

## 2015-04-03 DIAGNOSIS — E785 Hyperlipidemia, unspecified: Secondary | ICD-10-CM

## 2015-04-03 DIAGNOSIS — Z1159 Encounter for screening for other viral diseases: Secondary | ICD-10-CM

## 2015-04-03 NOTE — Patient Instructions (Signed)
Get your blood work before you leave     Check the  blood pressure 2 or 3 times a month    Be sure your blood pressure is between 110/65 and  145/85.  if it is consistently higher or lower, let me know    Next visit  for a routine checkup in 6 months, fasting    (30 minutes) Please schedule an appointment at the front desk

## 2015-04-03 NOTE — Progress Notes (Signed)
Subjective:    Patient ID: Tyler Madden, male    DOB: 1950-12-19, 64 y.o.   MRN: [id number]  DOS:  04/03/2015 Type of visit - description : Routine office visit Interval history: CAD: cardiology note reviewed, stable, stress test was negative. htn: good compliance of medication, bp is excellent hyperlipidemia: on lipitor, check labs prediabetes: due for a a1c.    Review of Systems Constitutional: No fever. No chills. No unexplained wt changes. No unusual sweats  HEENT: No dental problems, no ear discharge, no facial swelling, no voice changes. No eye discharge, no eye  redness , no  intolerance to light   Respiratory: No wheezing , no  difficulty breathing. No cough , no mucus production  Cardiovascular: No CP, no leg swelling , no  Palpitations  GI: no nausea, no vomiting, no diarrhea , no  abdominal pain.  No blood in the stools. No dysphagia, no odynophagia    Endocrine: No polyphagia, no polyuria , no polydipsia  GU: No dysuria, gross hematuria, difficulty urinating. No urinary urgency, no frequency.  Musculoskeletal: No joint swellings or unusual aches or pains  Skin: Has a couple of skin lesions in the back,  patient's wife is concerned about them   Allergic, immunologic: No environmental allergies , no  food allergies  Neurological: No dizziness no  syncope. No headaches. No diplopia, no slurred, no slurred speech, no motor deficits, no facial  Numbness  Hematological: No enlarged lymph nodes, no easy bruising , no unusual bleedings  Psychiatry: No suicidal ideas, no hallucinations, no beavior problems, no confusion.  No unusual/severe anxiety, no depression   Past Medical History  Diagnosis Date  . Hyperlipidemia   . Hypertension   . ED (erectile dysfunction)   . Diabetes mellitus, type II (HCC)      2011---> A1C 6.2    . CAD (coronary artery disease)     a. OOH VF arrest/NSTEMI 12/2012 - total occ distal LCx s/p DES, severe mid & distal RCA stenosis s/p  DES x2. 2D Echo with EF 65-70%.  . Cardiac arrest [facility](HCC)     a. OOH VF arrest 12/2012 in setting of NSTEMI, successfully resuscitated to alertness before arrival to hospital.  . Hypokalemia   . Acute renal insufficiency     a. Cr 1.6 on presentation to hospital after OOH arrest, improved.  . Prediabetes 03/10/2010    Qualifier: Diagnosis of  By: Julianne HandlerBarmer CMA, Danielle    . History of cardiovascular stress test     ETT-Myoview 8/16:  EF 66%, normal perfusion, low risk    Past Surgical History  Procedure Laterality Date  . Vasectomy    . Hydrocele excision    . Cardiac catheterization  12/2012  . Left heart catheterization with coronary angiogram N/A 01/14/2013    Procedure: LEFT HEART CATHETERIZATION WITH CORONARY ANGIOGRAM;  Surgeon: Micheline ChapmanMichael D Cooper, MD;  [facility];  Service: Cardiovascular;  Laterality: N/A;  . Percutaneous coronary stent intervention (pci-s)  12/28/2012    Procedure: PERCUTANEOUS CORONARY STENT INTERVENTION (PCI-S);  Surgeon: Micheline ChapmanMichael D Cooper, MD;  [facility];  Service: Cardiovascular;;    Social History   Social History  . Marital Status: Married    Spouse Name: N/A  . Number of Children: 2  . Years of Education: N/A   Occupational History  . 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 Smoking status: Former Smoker    Types: Cigarettes  . Smokeless  tobacco: Never Used  . Alcohol Use: No     Comment: socially, rare  . Drug Use: No  . Sexual Activity: Not on file   Other Topics Concern  . Not on file   Social History Narrative   Married, children --2 daughters     Living in [place] as [place] 03-2015        Medication List       This list is accurate as of: 04/03/15 11:59 PM.  Always use your most recent med list.               atorvastatin 80 MG tablet  Commonly known as:  LIPITOR  Take 1 tablet (80 mg total) by mouth daily.     lisinopril 5 MG tablet  Commonly known as:  PRINIVIL,ZESTRIL    Take 1 tablet (5 mg total) by mouth daily.     metoprolol tartrate 25 MG tablet  Commonly known as:  LOPRESSOR  Take 0.5 tablets (12.5 mg total) by mouth 2 (two) times daily.     nitroGLYCERIN 0.4 MG SL tablet  Commonly known as:  NITROSTAT  Place 1 tablet (0.4 mg total) under the tongue every 5 (five) minutes as needed for chest pain (up to 3 doses).     ticagrelor 60 MG Tabs tablet  Commonly known as:  BRILINTA  Take 1 tablet (60 mg total) by mouth 2 (two) times daily.           Objective:   Physical Exam BP 124/72 mmHg  Pulse 82  Temp(Src) 98.1 F (36.7 C) (Oral)  Ht  (1.778 m)  Wt 200 lb 6 oz (90.89 kg)  BMI 28.75 kg/m2  SpO2 97% General:   Well developed, well nourished . NAD.  Neck:   No  thyromegaly , normal carotid pulse HEENT:  Normocephalic . Face symmetric, atraumatic Lungs:  CTA B Normal respiratory effort, no intercostal retractions, no accessory muscle use. Heart: RRR,  no murmur.  No pretibial edema bilaterally  Abdomen:  Not distended, soft, non-tender. No rebound or rigidity. No bruit Skin: Exposed areas without rash. Not pale. Not jaundice. The 2 area of concern for the patient in the back are consistent with SK. Neurologic:  alert & oriented X3.  Speech normal, gait appropriate for age and unassisted Strength symmetric and appropriate for age.  Psych: Cognition and judgment appear intact.  Cooperative with normal attention span and concentration.  Behavior appropriate. No anxious or depressed appearing.    Assessment & Plan:   Assessment> Prediabetes  Dx 2011 A1c 6.2 HTN Hyperlipidemia ED CV: --Cardiac arrest >> STEMI (12-2011) EF 65% --Exercise tolerance tests 10-2014: ? +, f/u  Myocardial perfusion scan 11/27/2014 negative Acute renal insufficiency after cardiac arrest  H/o shingles (R abd-chest) ~ 12-2014 per pt   Plan: Prediabetes: Diet and exercise discussed, check A1c HTN: Seems well-controlled, check a  CBC Hyperlipidemia: Last LDL not at goal, continue with Lipitor 80, check labs. Skin lesions: Consistent with SK, warning  symptoms such as increasing in size, changing color, bleeding discussed with the patient Primary care:  flu shot and Prevnar.  Check a hep C RTC 6 months

## 2015-04-03 NOTE — Progress Notes (Signed)
Pre visit review using our clinic review tool, if applicable. No additional management support is needed unless otherwise documented below in the visit note. 

## 2015-04-03 NOTE — Assessment & Plan Note (Signed)
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

## 2015-04-04 LAB — LIPID PANEL
CHOL/HDL RATIO: 4
Cholesterol: 158 mg/dL (ref 0–200)
HDL: 38.2 mg/dL — ABNORMAL LOW (ref >39.00)
LDL CALC: 90 mg/dL (ref 0–99)
NONHDL: 120.03
Triglycerides: 151 mg/dL — ABNORMAL HIGH (ref 0.0–149.0)
VLDL: 30.2 mg/dL (ref 0.0–40.0)

## 2015-04-04 LAB — CBC WITH DIFFERENTIAL/PLATELET
BASOS ABS: 0.1 10*3/uL (ref 0.0–0.1)
BASOS PCT: 0.8 % (ref 0.0–3.0)
EOS ABS: 0.1 10*3/uL (ref 0.0–0.7)
Eosinophils Relative: 0.7 % (ref 0.0–5.0)
HCT: 42.5 % (ref 39.0–52.0)
HEMOGLOBIN: 14.4 g/dL (ref 13.0–17.0)
Lymphocytes Relative: 14.1 % (ref 12.0–46.0)
Lymphs Abs: 1.2 10*3/uL (ref 0.7–4.0)
MCHC: 33.8 g/dL (ref 30.0–36.0)
MCV: 87.6 fl (ref 78.0–100.0)
MONO ABS: 0.6 10*3/uL (ref 0.1–1.0)
Monocytes Relative: 7.7 % (ref 3.0–12.0)
Neutro Abs: 6.5 10*3/uL (ref 1.4–7.7)
Neutrophils Relative %: 76.7 % (ref 43.0–77.0)
Platelets: 231 10*3/uL (ref 150.0–400.0)
RBC: 4.85 Mil/uL (ref 4.22–5.81)
RDW: 13.2 % (ref 11.5–15.5)
WBC: 8.4 10*3/uL (ref 4.0–10.5)

## 2015-04-04 LAB — COMPREHENSIVE METABOLIC PANEL
ALBUMIN: 4.3 g/dL (ref 3.5–5.2)
ALK PHOS: 64 U/L (ref 39–117)
ALT: 37 U/L (ref 0–53)
AST: 23 U/L (ref 0–37)
BILIRUBIN TOTAL: 0.6 mg/dL (ref 0.2–1.2)
BUN: 20 mg/dL (ref 6–23)
CALCIUM: 9.2 mg/dL (ref 8.4–10.5)
CHLORIDE: 104 meq/L (ref 96–112)
CO2: 26 mEq/L (ref 19–32)
CREATININE: 1.24 mg/dL (ref 0.40–1.50)
GFR: 61.95 mL/min (ref >60.00)
Glucose, Bld: 85 mg/dL (ref 70–99)
Potassium: 3.8 mEq/L (ref 3.5–5.1)
SODIUM: 140 meq/L (ref 135–145)
TOTAL PROTEIN: 6.9 g/dL (ref 6.0–8.3)

## 2015-04-04 LAB — HEPATITIS C ANTIBODY: HCV AB: NEGATIVE

## 2015-04-04 LAB — HEMOGLOBIN A1C: HEMOGLOBIN A1C: 5.8 % (ref 4.6–6.5)

## 2015-04-05 DIAGNOSIS — Z09 Encounter for follow-up examination after completed treatment for conditions other than malignant neoplasm: Secondary | ICD-10-CM | POA: Insufficient documentation

## 2015-04-05 NOTE — Assessment & Plan Note (Signed)
Prediabetes: Diet and exercise discussed, check A1c HTN: Seems well-controlled, check a CBC Hyperlipidemia: Last LDL not at goal, continue with Lipitor 80, check labs. Skin lesions: Consistent with SK, warning  symptoms such as increasing in size, changing color, bleeding discussed with the patient Primary care:  flu shot and Prevnar.  Check a hep C RTC 6 months

## 2015-04-15 ENCOUNTER — Other Ambulatory Visit: Payer: Self-pay

## 2015-04-15 ENCOUNTER — Encounter: Payer: Self-pay | Admitting: Internal Medicine

## 2015-04-15 MED ORDER — LISINOPRIL 5 MG PO TABS: 5.0000 mg | ORAL_TABLET | Freq: Every day | ORAL | Status: AC

## 2015-04-15 MED ORDER — ATORVASTATIN CALCIUM 80 MG PO TABS: 80.0000 mg | ORAL_TABLET | Freq: Every day | ORAL | Status: AC

## 2015-04-15 MED ORDER — METOPROLOL TARTRATE 25 MG PO TABS: 12.5000 mg | ORAL_TABLET | Freq: Two times a day (BID) | ORAL | Status: AC

## 2015-08-26 ENCOUNTER — Encounter: Payer: Self-pay | Admitting: Internal Medicine

## 2015-09-23 ENCOUNTER — Encounter: Payer: Self-pay | Admitting: Cardiovascular Disease

## 2015-09-25 ENCOUNTER — Encounter: Payer: Self-pay | Admitting: Cardiovascular Disease

## 2015-10-01 ENCOUNTER — Encounter: Payer: Self-pay | Admitting: Cardiovascular Disease

## 2015-10-01 ENCOUNTER — Ambulatory Visit (INDEPENDENT_AMBULATORY_CARE_PROVIDER_SITE_OTHER): Payer: Medicare Other | Admitting: Cardiovascular Disease

## 2015-10-01 VITALS — BP 140/70 | HR 54 | Ht 69.0 in | Wt 199.6 lb

## 2015-10-01 DIAGNOSIS — I2510 Atherosclerotic heart disease of native coronary artery without angina pectoris: Secondary | ICD-10-CM | POA: Diagnosis not present

## 2015-10-01 DIAGNOSIS — E785 Hyperlipidemia, unspecified: Secondary | ICD-10-CM

## 2015-10-01 DIAGNOSIS — I10 Essential (primary) hypertension: Secondary | ICD-10-CM

## 2015-10-01 NOTE — Patient Instructions (Signed)
Medication Instructions:  Your physician has recommended you make the following change in your medication:  1. PLEASE STOP Brilinita when you finish your current supply 2. START Aspirin 81mg  take one tablet by mouth daily when Brilinta complete  Labwork: No new orders.   Testing/Procedures: No new orders.   Follow-Up: You have been referred to Dr Cherlyn LabellaGlen F[facility]etti [facility], [phone]   Any Other Special Instructions Will Be Listed Below (If Applicable).     If you need a refill on your cardiac medications before your next appointment, please call your pharmacy.

## 2015-10-01 NOTE — Progress Notes (Signed)
Cardiology Office Note Date:  10/01/2015   ID:  Tyler Madden, DOB 1951/02/11, MRN [id number]  PCP:  Neal DyHOUSER, ADAM, MD  Cardiologist:  Tonny Bollmanooper, Koleman Marling, MD    Chief Complaint  Patient presents with  . Hypertension    no cp,sob,lee,or claudication    History of Present Illness: Tyler BondsKevin J Carbonell is a 65 y.o. male who presents for followup evaluation. The patient has coronary artery disease and sustained an out of hospital ventricular fibrillation arrest in September 2014. He was playing tennis at the time and underwent CPR and defibrillation. He underwent urgent cardiac catheterization demonstrating severe two-vessel coronary artery disease and he was treated with drug-eluting stents in the left circumflex and right coronary arteries. His left ventricular function was normal with an ejection fraction of 65-70%.  The patient is here alone today. He has been doing well. He has [place] to [place] the town [place]. Today, he denies symptoms of palpitations, chest pain, shortness of breath, orthopnea, PND, lower extremity edema, dizziness, or syncope. He walks regularly with his wife for exercise and denies any exertional symptoms. He's planning a mission trip to [place] and has questions about the safety of [place].  Past Medical History  Diagnosis Date  . Hyperlipidemia   . Hypertension   . ED (erectile dysfunction)   . Diabetes mellitus, type II (HCC)      2011---> A1C 6.2    . CAD (coronary artery disease)     a. OOH VF arrest/NSTEMI 12/2012 - total occ distal LCx s/p DES, severe mid & distal RCA stenosis s/p DES x2. 2D Echo with EF 65-70%.  . Cardiac arrest [facility](HCC)     a. OOH VF arrest 12/2012 in setting of NSTEMI, successfully resuscitated to alertness before arrival to hospital.  . Hypokalemia   . Acute renal insufficiency     a. Cr 1.6 on presentation to hospital after OOH arrest, improved.  . Prediabetes 03/10/2010    Qualifier: Diagnosis of  By:  Julianne HandlerBarmer CMA, Danielle    . History of cardiovascular stress test     ETT-Myoview 8/16:  EF 66%, normal perfusion, low risk    Past Surgical History  Procedure Laterality Date  . Vasectomy    . Hydrocele excision    . Cardiac catheterization  12/2012  . Left heart catheterization with coronary angiogram N/A 01/19/2013    Procedure: LEFT HEART CATHETERIZATION WITH CORONARY ANGIOGRAM;  Surgeon: Micheline ChapmanMichael D Freemon Binford, MD;  [facility];  Service: Cardiovascular;  Laterality: N/A;  . Percutaneous coronary stent intervention (pci-s)  01/05/2013    Procedure: PERCUTANEOUS CORONARY STENT INTERVENTION (PCI-S);  Surgeon: Micheline ChapmanMichael D Labarron Durnin, MD;  [facility];  Service: Cardiovascular;;    Current Outpatient Prescriptions  Medication Sig Dispense Refill  . atorvastatin (LIPITOR) 80 MG tablet Take 1 tablet (80 mg total) by mouth daily. 30 tablet 6  . lisinopril (PRINIVIL,ZESTRIL) 5 MG tablet Take 1 tablet (5 mg total) by mouth daily. 30 tablet 6  . metoprolol tartrate (LOPRESSOR) 25 MG tablet Take 0.5 tablets (12.5 mg total) by mouth 2 (two) times daily. 30 tablet 6  . nitroGLYCERIN (NITROSTAT) 0.4 MG SL tablet Place 1 tablet (0.4 mg total) under the tongue every 5 (five) minutes as needed for chest pain (up to 3 doses). (Patient not taking: Reported on 04/03/2015) 25 tablet 4  . ticagrelor (BRILINTA) 60 MG TABS tablet Take 1 tablet (60 mg total) by mouth 2 (two) times daily.  60 tablet 11   No current facility-administered medications for this visit.    Allergies:   Codeine and Tadalafil   Social History:  The patient  reports that he has quit smoking. His smoking use included Cigarettes. He has never used smokeless tobacco. He reports that he does not drink alcohol or use illicit drugs.   Family History:  The patient's  family history includes Alcohol abuse in his father; Cancer in his sister; Coronary artery disease in his mother; Diabetes in his father and mother; Hypertension in his  mother. There is no history of Stroke, Colon cancer, or Prostate cancer.    ROS:  Please see the history of present illness.  Otherwise, review of systems is positive for hearing loss.  All other systems are reviewed and negative.    PHYSICAL EXAM: VS:  BP 140/70 mmHg  Pulse 54  Ht 5\' 9"  (1.753 m)  Wt 199 lb 9.6 oz (90.538 kg)  BMI 29.46 kg/m2 , BMI Body mass index is 29.46 kg/(m^2). GEN: Well nourished, well developed, in no acute distress HEENT: normal Neck: no JVD, no masses. No carotid bruits Cardiac: RRR without murmur or gallop                Respiratory:  clear to auscultation bilaterally, normal work of breathing GI: soft, nontender, nondistended, + BS MS: no deformity or atrophy Ext: no pretibial edema, pedal pulses 2+= bilaterally Skin: warm and dry, no rash Neuro:  Strength and sensation are intact Psych: euthymic mood, full affect  EKG:  EKG is ordered today. The ekg ordered today shows sinus bradycardia 54 bpm, T-wave abnormality consider inferior ischemia, no significant change from last tracing  Recent Labs: 04/03/2015: ALT 37; BUN 20; Creatinine, Ser 1.24; Hemoglobin 14.4; Platelets 231.0; Potassium 3.8; Sodium 140   Lipid Panel     Component Value Date/Time   CHOL 158 04/03/2015 1550   TRIG 151.0* 04/03/2015 1550   HDL 38.20* 04/03/2015 1550   CHOLHDL 4 04/03/2015 1550   VLDL 30.2 04/03/2015 1550   LDLCALC 90 04/03/2015 1550      Wt Readings from Last 3 Encounters:  10/01/15 199 lb 9.6 oz (90.538 kg)  04/03/15 200 lb 6 oz (90.89 kg)  11/27/14 199 lb (90.266 kg)     Cardiac Studies Reviewed: Stress Myoview study 11/27/2014: Study Highlights     Nuclear stress EF: 66%.  There was no ST segment deviation noted during stress.  The study is normal.  This is a low risk study.  The left ventricular ejection fraction is hyperdynamic (>65%).     ASSESSMENT AND PLAN: 1.  CAD, native vessel, without angina: He is now treated with Brilinta 60 mg  twice daily. His most recent stress test was reviewed today. He is approaching 3 years out from his coronary event and I have recommended switching back to aspirin 81 mg daily. He will discontinue brilinta.  2. Essential hypertension: Blood pressure is controlled with lisinopril and metoprolol. Labs are reviewed and most recent creatinine is 1.31. We discussed lifestyle modification with weight loss, diet, and exercise.  3. Hyperlipidemia: Treated with atorvastatin 80 mg. Most recent labs reviewed with a total cholesterol of 170, triglycerides 138, HDL 45, and LDL 97  Current medicines are reviewed with the patient today.  The patient does not have concerns regarding medicines.  Labs/ tests ordered today include:   Orders Placed This Encounter  Procedures  . EKG 12-Lead    Disposition:   Will refer to [facility] in [place] for further cardiology FU since the patient has relocated.  Enzo Bi, MD  10/01/2015 1:45 PM    [facility] [address] [place], [place], [place]  [address] Phone: [phone]; Fax: [phone]

## 2015-10-11 ENCOUNTER — Ambulatory Visit: Payer: Medicare Other | Admitting: Cardiovascular Disease

## 2015-10-11 ENCOUNTER — Encounter: Payer: Self-pay | Admitting: Internal Medicine

## 2015-10-11 ENCOUNTER — Other Ambulatory Visit: Payer: Medicare Other

## 2015-10-11 ENCOUNTER — Ambulatory Visit: Payer: Medicare Other | Admitting: Internal Medicine

## 2015-10-28 ENCOUNTER — Other Ambulatory Visit: Payer: Self-pay | Admitting: Cardiovascular Disease

## 2015-10-28 NOTE — Telephone Encounter (Signed)
10/01/2015 9:16 AM After Visit Summary Printed Iona CoachLauren W Brown, RN      Patient Instructions     Medication Instructions:  Your physician has recommended you make the following change in your medication:  1. PLEASE STOP Brilinita when you finish your current supply 2. START Aspirin 81mg  take one tablet by mouth daily when Brilinta complete

## 2015-11-10 ENCOUNTER — Other Ambulatory Visit: Payer: Self-pay | Admitting: Internal Medicine

## 2015-11-11 ENCOUNTER — Other Ambulatory Visit: Payer: Self-pay | Admitting: Internal Medicine

## 2017-05-23 IMAGING — NM NM MISC PROCEDURE
3 series · 18 of 18 positions shown · non-contrast
Comparison: none

[Series 1: stress-gsp-mc_(id)_sa · 6.4mm · 6.40mm/px · 6 of 512 frames shown]
[frame 43/512]
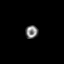
[frame 128/512]
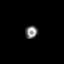
[frame 214/512]
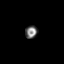
[frame 299/512]
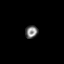
[frame 384/512]
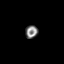
[frame 470/512]
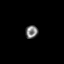

[Series 1: rest_(id)_sa · 6.4mm · 6.40mm/px · 6 of 64 frames shown]
[frame 6/64]
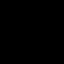
[frame 16/64]
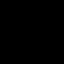
[frame 27/64]
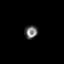
[frame 38/64]
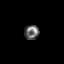
[frame 48/64]
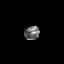
[frame 59/64]
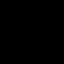

[Series 1: stress-sum-em-mc_(id)_sa · 6.4mm · 6.40mm/px · 6 of 64 frames shown]
[frame 6/64]
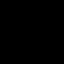
[frame 16/64]
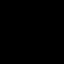
[frame 27/64]
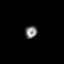
[frame 38/64]
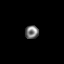
[frame 48/64]
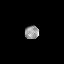
[frame 59/64]
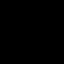

[18 of 18 positions shown; findings below may reference images not displayed]

Canned report from images found in remote index.

Refer to host system for actual result text.
